# Patient Record
Sex: Female | Born: 1950 | Race: White | Hispanic: No | Smoking: Never smoker
Health system: Southern US, Community
[De-identification: ages and names within clinical notes are randomized; demographics above are authoritative.]

## PROBLEM LIST (undated history)

## (undated) DIAGNOSIS — C50919 Malignant neoplasm of unspecified site of unspecified female breast: Secondary | ICD-10-CM

## (undated) DIAGNOSIS — Z853 Personal history of malignant neoplasm of breast: Secondary | ICD-10-CM

## (undated) DIAGNOSIS — Z801 Family history of malignant neoplasm of trachea, bronchus and lung: Secondary | ICD-10-CM

## (undated) DIAGNOSIS — Z9221 Personal history of antineoplastic chemotherapy: Secondary | ICD-10-CM

## (undated) DIAGNOSIS — Z803 Family history of malignant neoplasm of breast: Secondary | ICD-10-CM

## (undated) DIAGNOSIS — Z923 Personal history of irradiation: Secondary | ICD-10-CM

## (undated) DIAGNOSIS — Z800 Family history of malignant neoplasm of digestive organs: Secondary | ICD-10-CM

## (undated) HISTORY — DX: Personal history of malignant neoplasm of breast: Z85.3

## (undated) HISTORY — DX: Family history of malignant neoplasm of digestive organs: Z80.0

## (undated) HISTORY — DX: Family history of malignant neoplasm of trachea, bronchus and lung: Z80.1

## (undated) HISTORY — PX: REDUCTION MAMMAPLASTY: SUR839

## (undated) HISTORY — DX: Family history of malignant neoplasm of breast: Z80.3

## (undated) HISTORY — PX: BREAST BIOPSY: SHX20

---

## 1997-03-15 HISTORY — PX: BREAST LUMPECTOMY: SHX2

## 2000-07-29 ENCOUNTER — Other Ambulatory Visit: Admission: RE | Admit: 2000-07-29 | Discharge: 2000-07-29 | Payer: Self-pay | Admitting: *Deleted

## 2000-08-09 ENCOUNTER — Encounter: Admission: RE | Admit: 2000-08-09 | Discharge: 2000-08-09 | Payer: Self-pay | Admitting: *Deleted

## 2000-08-09 ENCOUNTER — Encounter: Payer: Self-pay | Admitting: *Deleted

## 2001-08-11 ENCOUNTER — Encounter: Payer: Self-pay | Admitting: *Deleted

## 2001-08-11 ENCOUNTER — Encounter: Admission: RE | Admit: 2001-08-11 | Discharge: 2001-08-11 | Payer: Self-pay | Admitting: *Deleted

## 2001-09-05 ENCOUNTER — Other Ambulatory Visit: Admission: RE | Admit: 2001-09-05 | Discharge: 2001-09-05 | Payer: Self-pay | Admitting: *Deleted

## 2002-06-19 ENCOUNTER — Encounter: Admission: RE | Admit: 2002-06-19 | Discharge: 2002-06-19 | Payer: Self-pay | Admitting: Internal Medicine

## 2002-06-19 ENCOUNTER — Encounter: Payer: Self-pay | Admitting: Internal Medicine

## 2002-08-15 ENCOUNTER — Encounter: Payer: Self-pay | Admitting: *Deleted

## 2002-08-15 ENCOUNTER — Encounter: Admission: RE | Admit: 2002-08-15 | Discharge: 2002-08-15 | Payer: Self-pay | Admitting: *Deleted

## 2002-10-22 ENCOUNTER — Other Ambulatory Visit: Admission: RE | Admit: 2002-10-22 | Discharge: 2002-10-22 | Payer: Self-pay | Admitting: *Deleted

## 2003-08-21 ENCOUNTER — Encounter: Admission: RE | Admit: 2003-08-21 | Discharge: 2003-08-21 | Payer: Self-pay | Admitting: Oncology

## 2004-02-20 ENCOUNTER — Ambulatory Visit: Payer: Self-pay | Admitting: Oncology

## 2004-08-27 ENCOUNTER — Encounter: Admission: RE | Admit: 2004-08-27 | Discharge: 2004-08-27 | Payer: Self-pay | Admitting: Oncology

## 2005-02-18 ENCOUNTER — Ambulatory Visit: Payer: Self-pay | Admitting: Oncology

## 2005-08-31 ENCOUNTER — Encounter: Admission: RE | Admit: 2005-08-31 | Discharge: 2005-08-31 | Payer: Self-pay | Admitting: Oncology

## 2006-02-08 ENCOUNTER — Ambulatory Visit: Payer: Self-pay | Admitting: Oncology

## 2006-02-11 LAB — CBC WITH DIFFERENTIAL/PLATELET
BASO%: 0.6 % (ref 0.0–2.0)
Basophils Absolute: 0 10*3/uL (ref 0.0–0.1)
EOS%: 2.7 % (ref 0.0–7.0)
Eosinophils Absolute: 0.1 10*3/uL (ref 0.0–0.5)
HCT: 37.5 % (ref 34.8–46.6)
HGB: 13 g/dL (ref 11.6–15.9)
LYMPH%: 25.9 % (ref 14.0–48.0)
MCH: 33.1 pg (ref 26.0–34.0)
MCHC: 34.7 g/dL (ref 32.0–36.0)
MCV: 95.2 fL (ref 81.0–101.0)
MONO#: 0.4 10*3/uL (ref 0.1–0.9)
MONO%: 9.2 % (ref 0.0–13.0)
NEUT#: 3 10*3/uL (ref 1.5–6.5)
NEUT%: 61.6 % (ref 39.6–76.8)
Platelets: 183 10*3/uL (ref 145–400)
RBC: 3.93 10*6/uL (ref 3.70–5.32)
RDW: 13.6 % (ref 11.3–14.5)
WBC: 4.8 10*3/uL (ref 3.9–10.0)
lymph#: 1.2 10*3/uL (ref 0.9–3.3)

## 2006-02-11 LAB — COMPREHENSIVE METABOLIC PANEL
ALT: 15 U/L (ref 0–35)
AST: 16 U/L (ref 0–37)
Albumin: 4.3 g/dL (ref 3.5–5.2)
Alkaline Phosphatase: 65 U/L (ref 39–117)
BUN: 19 mg/dL (ref 6–23)
CO2: 26 mEq/L (ref 19–32)
Calcium: 9.5 mg/dL (ref 8.4–10.5)
Chloride: 105 mEq/L (ref 96–112)
Creatinine, Ser: 0.79 mg/dL (ref 0.40–1.20)
Glucose, Bld: 68 mg/dL — ABNORMAL LOW (ref 70–99)
Potassium: 3.9 mEq/L (ref 3.5–5.3)
Sodium: 138 mEq/L (ref 135–145)
Total Bilirubin: 0.4 mg/dL (ref 0.3–1.2)
Total Protein: 7 g/dL (ref 6.0–8.3)

## 2006-09-07 ENCOUNTER — Encounter: Admission: RE | Admit: 2006-09-07 | Discharge: 2006-09-07 | Payer: Self-pay | Admitting: Oncology

## 2006-09-26 ENCOUNTER — Encounter: Admission: RE | Admit: 2006-09-26 | Discharge: 2006-09-26 | Payer: Self-pay | Admitting: Oncology

## 2007-02-13 ENCOUNTER — Ambulatory Visit: Payer: Self-pay | Admitting: Oncology

## 2007-02-13 LAB — COMPREHENSIVE METABOLIC PANEL
ALT: 15 U/L (ref 0–35)
AST: 18 U/L (ref 0–37)
Albumin: 4.5 g/dL (ref 3.5–5.2)
Alkaline Phosphatase: 68 U/L (ref 39–117)
BUN: 17 mg/dL (ref 6–23)
CO2: 25 mEq/L (ref 19–32)
Calcium: 10.4 mg/dL (ref 8.4–10.5)
Chloride: 104 mEq/L (ref 96–112)
Creatinine, Ser: 0.94 mg/dL (ref 0.40–1.20)
Glucose, Bld: 78 mg/dL (ref 70–99)
Potassium: 4.2 mEq/L (ref 3.5–5.3)
Sodium: 140 mEq/L (ref 135–145)
Total Bilirubin: 0.5 mg/dL (ref 0.3–1.2)
Total Protein: 7.4 g/dL (ref 6.0–8.3)

## 2007-02-13 LAB — CBC WITH DIFFERENTIAL/PLATELET
BASO%: 0.5 % (ref 0.0–2.0)
Basophils Absolute: 0 10*3/uL (ref 0.0–0.1)
EOS%: 1.8 % (ref 0.0–7.0)
Eosinophils Absolute: 0.1 10*3/uL (ref 0.0–0.5)
HCT: 38.2 % (ref 34.8–46.6)
HGB: 13.6 g/dL (ref 11.6–15.9)
LYMPH%: 32.3 % (ref 14.0–48.0)
MCH: 33.6 pg (ref 26.0–34.0)
MCHC: 35.6 g/dL (ref 32.0–36.0)
MCV: 94.5 fL (ref 81.0–101.0)
MONO#: 0.4 10*3/uL (ref 0.1–0.9)
MONO%: 10.7 % (ref 0.0–13.0)
NEUT#: 2.1 10*3/uL (ref 1.5–6.5)
NEUT%: 54.7 % (ref 39.6–76.8)
Platelets: 168 10*3/uL (ref 145–400)
RBC: 4.04 10*6/uL (ref 3.70–5.32)
RDW: 12.9 % (ref 11.3–14.5)
WBC: 3.9 10*3/uL (ref 3.9–10.0)
lymph#: 1.2 10*3/uL (ref 0.9–3.3)

## 2007-09-08 ENCOUNTER — Encounter: Admission: RE | Admit: 2007-09-08 | Discharge: 2007-09-08 | Payer: Self-pay | Admitting: Oncology

## 2008-01-16 ENCOUNTER — Encounter: Admission: RE | Admit: 2008-01-16 | Discharge: 2008-01-16 | Payer: Self-pay | Admitting: Family Medicine

## 2008-02-21 ENCOUNTER — Ambulatory Visit: Payer: Self-pay | Admitting: Oncology

## 2008-02-23 LAB — CBC WITH DIFFERENTIAL/PLATELET
BASO%: 0.6 % (ref 0.0–2.0)
Basophils Absolute: 0 10*3/uL (ref 0.0–0.1)
EOS%: 2.4 % (ref 0.0–7.0)
Eosinophils Absolute: 0.1 10*3/uL (ref 0.0–0.5)
HCT: 39.5 % (ref 34.8–46.6)
HGB: 13.8 g/dL (ref 11.6–15.9)
LYMPH%: 30.5 % (ref 14.0–48.0)
MCH: 33.2 pg (ref 26.0–34.0)
MCHC: 35 g/dL (ref 32.0–36.0)
MCV: 94.9 fL (ref 81.0–101.0)
MONO#: 0.3 10*3/uL (ref 0.1–0.9)
MONO%: 7.7 % (ref 0.0–13.0)
NEUT#: 2.4 10*3/uL (ref 1.5–6.5)
NEUT%: 58.8 % (ref 39.6–76.8)
Platelets: 180 10*3/uL (ref 145–400)
RBC: 4.16 10*6/uL (ref 3.70–5.32)
RDW: 12.9 % (ref 11.3–14.5)
WBC: 4.1 10*3/uL (ref 3.9–10.0)
lymph#: 1.3 10*3/uL (ref 0.9–3.3)

## 2008-02-23 LAB — COMPREHENSIVE METABOLIC PANEL
ALT: 13 U/L (ref 0–35)
AST: 17 U/L (ref 0–37)
Albumin: 4.3 g/dL (ref 3.5–5.2)
Alkaline Phosphatase: 57 U/L (ref 39–117)
BUN: 13 mg/dL (ref 6–23)
CO2: 27 mEq/L (ref 19–32)
Calcium: 9.9 mg/dL (ref 8.4–10.5)
Chloride: 104 mEq/L (ref 96–112)
Creatinine, Ser: 0.82 mg/dL (ref 0.40–1.20)
Glucose, Bld: 84 mg/dL (ref 70–99)
Potassium: 3.8 mEq/L (ref 3.5–5.3)
Sodium: 139 mEq/L (ref 135–145)
Total Bilirubin: 0.6 mg/dL (ref 0.3–1.2)
Total Protein: 7.2 g/dL (ref 6.0–8.3)

## 2008-09-09 ENCOUNTER — Encounter: Admission: RE | Admit: 2008-09-09 | Discharge: 2008-09-09 | Payer: Self-pay | Admitting: Oncology

## 2009-02-26 ENCOUNTER — Ambulatory Visit: Payer: Self-pay | Admitting: Oncology

## 2009-02-28 LAB — CBC WITH DIFFERENTIAL/PLATELET
BASO%: 0.4 % (ref 0.0–2.0)
Basophils Absolute: 0 10*3/uL (ref 0.0–0.1)
EOS%: 1.9 % (ref 0.0–7.0)
Eosinophils Absolute: 0.1 10*3/uL (ref 0.0–0.5)
HCT: 40.2 % (ref 34.8–46.6)
HGB: 13.9 g/dL (ref 11.6–15.9)
LYMPH%: 28.6 % (ref 14.0–49.7)
MCH: 33.8 pg (ref 25.1–34.0)
MCHC: 34.6 g/dL (ref 31.5–36.0)
MCV: 97.7 fL (ref 79.5–101.0)
MONO#: 0.4 10*3/uL (ref 0.1–0.9)
MONO%: 9.7 % (ref 0.0–14.0)
NEUT#: 2.5 10*3/uL (ref 1.5–6.5)
NEUT%: 59.4 % (ref 38.4–76.8)
Platelets: 180 10*3/uL (ref 145–400)
RBC: 4.11 10*6/uL (ref 3.70–5.45)
RDW: 13.6 % (ref 11.2–14.5)
WBC: 4.2 10*3/uL (ref 3.9–10.3)
lymph#: 1.2 10*3/uL (ref 0.9–3.3)

## 2009-02-28 LAB — COMPREHENSIVE METABOLIC PANEL
ALT: 14 U/L (ref 0–35)
AST: 16 U/L (ref 0–37)
Albumin: 4.3 g/dL (ref 3.5–5.2)
Alkaline Phosphatase: 56 U/L (ref 39–117)
BUN: 16 mg/dL (ref 6–23)
CO2: 28 mEq/L (ref 19–32)
Calcium: 9.8 mg/dL (ref 8.4–10.5)
Chloride: 104 mEq/L (ref 96–112)
Creatinine, Ser: 0.86 mg/dL (ref 0.40–1.20)
Glucose, Bld: 72 mg/dL (ref 70–99)
Potassium: 3.8 mEq/L (ref 3.5–5.3)
Sodium: 140 mEq/L (ref 135–145)
Total Bilirubin: 0.4 mg/dL (ref 0.3–1.2)
Total Protein: 6.9 g/dL (ref 6.0–8.3)

## 2009-09-09 ENCOUNTER — Encounter: Admission: RE | Admit: 2009-09-09 | Discharge: 2009-09-09 | Payer: Self-pay | Admitting: Oncology

## 2010-02-26 ENCOUNTER — Ambulatory Visit: Payer: Self-pay | Admitting: Oncology

## 2010-02-27 LAB — CBC WITH DIFFERENTIAL/PLATELET
BASO%: 0.5 % (ref 0.0–2.0)
Basophils Absolute: 0 10*3/uL (ref 0.0–0.1)
EOS%: 1.6 % (ref 0.0–7.0)
Eosinophils Absolute: 0.1 10*3/uL (ref 0.0–0.5)
HCT: 40 % (ref 34.8–46.6)
HGB: 13.7 g/dL (ref 11.6–15.9)
LYMPH%: 32.8 % (ref 14.0–49.7)
MCH: 33 pg (ref 25.1–34.0)
MCHC: 34.1 g/dL (ref 31.5–36.0)
MCV: 96.6 fL (ref 79.5–101.0)
MONO#: 0.4 10*3/uL (ref 0.1–0.9)
MONO%: 9.6 % (ref 0.0–14.0)
NEUT#: 2.3 10*3/uL (ref 1.5–6.5)
NEUT%: 55.5 % (ref 38.4–76.8)
Platelets: 184 10*3/uL (ref 145–400)
RBC: 4.14 10*6/uL (ref 3.70–5.45)
RDW: 13.3 % (ref 11.2–14.5)
WBC: 4.1 10*3/uL (ref 3.9–10.3)
lymph#: 1.3 10*3/uL (ref 0.9–3.3)

## 2010-02-27 LAB — COMPREHENSIVE METABOLIC PANEL
ALT: 15 U/L (ref 0–35)
AST: 18 U/L (ref 0–37)
Albumin: 4.3 g/dL (ref 3.5–5.2)
Alkaline Phosphatase: 53 U/L (ref 39–117)
BUN: 13 mg/dL (ref 6–23)
CO2: 29 mEq/L (ref 19–32)
Calcium: 10 mg/dL (ref 8.4–10.5)
Chloride: 103 mEq/L (ref 96–112)
Creatinine, Ser: 0.81 mg/dL (ref 0.40–1.20)
Glucose, Bld: 95 mg/dL (ref 70–99)
Potassium: 3.9 mEq/L (ref 3.5–5.3)
Sodium: 140 mEq/L (ref 135–145)
Total Bilirubin: 0.5 mg/dL (ref 0.3–1.2)
Total Protein: 7 g/dL (ref 6.0–8.3)

## 2010-03-04 ENCOUNTER — Encounter
Admission: RE | Admit: 2010-03-04 | Discharge: 2010-03-04 | Payer: Self-pay | Source: Home / Self Care | Attending: Oncology | Admitting: Oncology

## 2010-08-18 ENCOUNTER — Other Ambulatory Visit: Payer: Self-pay | Admitting: Oncology

## 2010-08-18 DIAGNOSIS — Z853 Personal history of malignant neoplasm of breast: Secondary | ICD-10-CM

## 2010-08-18 DIAGNOSIS — Z1231 Encounter for screening mammogram for malignant neoplasm of breast: Secondary | ICD-10-CM

## 2010-09-15 ENCOUNTER — Ambulatory Visit
Admission: RE | Admit: 2010-09-15 | Discharge: 2010-09-15 | Disposition: A | Discharge status: Home / Self Care | Payer: BC Managed Care – PPO | Source: Ambulatory Visit | Attending: Oncology | Admitting: Oncology

## 2010-09-15 DIAGNOSIS — Z1231 Encounter for screening mammogram for malignant neoplasm of breast: Secondary | ICD-10-CM

## 2010-09-15 DIAGNOSIS — Z853 Personal history of malignant neoplasm of breast: Secondary | ICD-10-CM

## 2010-11-11 ENCOUNTER — Other Ambulatory Visit: Payer: Self-pay | Admitting: Oncology

## 2010-11-11 ENCOUNTER — Encounter (HOSPITAL_BASED_OUTPATIENT_CLINIC_OR_DEPARTMENT_OTHER): Payer: BC Managed Care – PPO | Admitting: Oncology

## 2010-11-11 DIAGNOSIS — Z1231 Encounter for screening mammogram for malignant neoplasm of breast: Secondary | ICD-10-CM

## 2010-11-11 DIAGNOSIS — Z853 Personal history of malignant neoplasm of breast: Secondary | ICD-10-CM

## 2010-11-11 LAB — COMPREHENSIVE METABOLIC PANEL
ALT: 15 U/L (ref 0–35)
AST: 17 U/L (ref 0–37)
Albumin: 4.5 g/dL (ref 3.5–5.2)
Alkaline Phosphatase: 25 U/L — ABNORMAL LOW (ref 39–117)
BUN: 13 mg/dL (ref 6–23)
CO2: 26 mEq/L (ref 19–32)
Calcium: 9.7 mg/dL (ref 8.4–10.5)
Chloride: 103 mEq/L (ref 96–112)
Creatinine, Ser: 0.81 mg/dL (ref 0.50–1.10)
Glucose, Bld: 54 mg/dL — ABNORMAL LOW (ref 70–99)
Potassium: 4.2 mEq/L (ref 3.5–5.3)
Sodium: 139 mEq/L (ref 135–145)
Total Bilirubin: 0.4 mg/dL (ref 0.3–1.2)
Total Protein: 7 g/dL (ref 6.0–8.3)

## 2010-11-11 LAB — CBC WITH DIFFERENTIAL/PLATELET
BASO%: 0.4 % (ref 0.0–2.0)
Basophils Absolute: 0 10*3/uL (ref 0.0–0.1)
EOS%: 1.3 % (ref 0.0–7.0)
Eosinophils Absolute: 0.1 10*3/uL (ref 0.0–0.5)
HCT: 37.7 % (ref 34.8–46.6)
HGB: 13.2 g/dL (ref 11.6–15.9)
LYMPH%: 26.5 % (ref 14.0–49.7)
MCH: 33.5 pg (ref 25.1–34.0)
MCHC: 35 g/dL (ref 31.5–36.0)
MCV: 95.5 fL (ref 79.5–101.0)
MONO#: 0.5 10*3/uL (ref 0.1–0.9)
MONO%: 9.7 % (ref 0.0–14.0)
NEUT#: 3.3 10*3/uL (ref 1.5–6.5)
NEUT%: 62.1 % (ref 38.4–76.8)
Platelets: 179 10*3/uL (ref 145–400)
RBC: 3.95 10*6/uL (ref 3.70–5.45)
RDW: 12.7 % (ref 11.2–14.5)
WBC: 5.2 10*3/uL (ref 3.9–10.3)
lymph#: 1.4 10*3/uL (ref 0.9–3.3)

## 2011-09-21 ENCOUNTER — Ambulatory Visit: Payer: BC Managed Care – PPO

## 2011-09-28 ENCOUNTER — Ambulatory Visit
Admission: RE | Admit: 2011-09-28 | Discharge: 2011-09-28 | Disposition: A | Discharge status: Home / Self Care | Payer: BC Managed Care – PPO | Source: Ambulatory Visit | Attending: Oncology | Admitting: Oncology

## 2011-09-28 DIAGNOSIS — Z1231 Encounter for screening mammogram for malignant neoplasm of breast: Secondary | ICD-10-CM

## 2011-10-08 ENCOUNTER — Telehealth: Payer: Self-pay | Admitting: Oncology

## 2011-10-08 NOTE — Telephone Encounter (Signed)
lmonvm for pt re appt for 8/28 and mailed schedule.

## 2011-11-10 ENCOUNTER — Ambulatory Visit: Payer: BC Managed Care – PPO | Admitting: Oncology

## 2011-11-10 ENCOUNTER — Other Ambulatory Visit: Payer: BC Managed Care – PPO | Admitting: Lab

## 2011-11-24 ENCOUNTER — Other Ambulatory Visit (HOSPITAL_COMMUNITY): Payer: Self-pay | Admitting: Chiropractic Medicine

## 2011-11-24 ENCOUNTER — Ambulatory Visit (HOSPITAL_COMMUNITY)
Admission: RE | Admit: 2011-11-24 | Discharge: 2011-11-24 | Disposition: A | Discharge status: Home / Self Care | Payer: BC Managed Care – PPO | Source: Ambulatory Visit | Attending: Chiropractic Medicine | Admitting: Chiropractic Medicine

## 2011-11-24 DIAGNOSIS — J984 Other disorders of lung: Secondary | ICD-10-CM | POA: Insufficient documentation

## 2011-11-24 DIAGNOSIS — M47812 Spondylosis without myelopathy or radiculopathy, cervical region: Secondary | ICD-10-CM | POA: Insufficient documentation

## 2011-11-24 DIAGNOSIS — M25519 Pain in unspecified shoulder: Secondary | ICD-10-CM | POA: Insufficient documentation

## 2011-11-24 DIAGNOSIS — R52 Pain, unspecified: Secondary | ICD-10-CM

## 2011-11-26 ENCOUNTER — Other Ambulatory Visit (HOSPITAL_BASED_OUTPATIENT_CLINIC_OR_DEPARTMENT_OTHER): Payer: BC Managed Care – PPO | Admitting: Lab

## 2011-11-26 ENCOUNTER — Other Ambulatory Visit: Payer: Self-pay

## 2011-11-26 ENCOUNTER — Ambulatory Visit (HOSPITAL_BASED_OUTPATIENT_CLINIC_OR_DEPARTMENT_OTHER): Payer: BC Managed Care – PPO | Admitting: Oncology

## 2011-11-26 VITALS — BP 110/70 | HR 70 | Temp 97.6°F | Resp 20 | Ht 66.5 in | Wt 159.7 lb

## 2011-11-26 DIAGNOSIS — Z853 Personal history of malignant neoplasm of breast: Secondary | ICD-10-CM

## 2011-11-26 DIAGNOSIS — C50919 Malignant neoplasm of unspecified site of unspecified female breast: Secondary | ICD-10-CM

## 2011-11-26 DIAGNOSIS — M62838 Other muscle spasm: Secondary | ICD-10-CM

## 2011-11-26 DIAGNOSIS — M899 Disorder of bone, unspecified: Secondary | ICD-10-CM

## 2011-11-26 DIAGNOSIS — M949 Disorder of cartilage, unspecified: Secondary | ICD-10-CM

## 2011-11-26 DIAGNOSIS — M5030 Other cervical disc degeneration, unspecified cervical region: Secondary | ICD-10-CM

## 2011-11-26 LAB — COMPREHENSIVE METABOLIC PANEL (CC13)
ALT: 16 U/L (ref 0–55)
AST: 17 U/L (ref 5–34)
Albumin: 3.9 g/dL (ref 3.5–5.0)
Alkaline Phosphatase: 58 U/L (ref 40–150)
BUN: 14 mg/dL (ref 7.0–26.0)
CO2: 25 mEq/L (ref 22–29)
Calcium: 9.6 mg/dL (ref 8.4–10.4)
Chloride: 105 mEq/L (ref 98–107)
Creatinine: 0.8 mg/dL (ref 0.6–1.1)
Glucose: 79 mg/dl (ref 70–99)
Potassium: 4.6 mEq/L (ref 3.5–5.1)
Sodium: 139 mEq/L (ref 136–145)
Total Bilirubin: 0.3 mg/dL (ref 0.20–1.20)
Total Protein: 6.9 g/dL (ref 6.4–8.3)

## 2011-11-26 LAB — CBC WITH DIFFERENTIAL/PLATELET
BASO%: 0.7 % (ref 0.0–2.0)
Basophils Absolute: 0 10*3/uL (ref 0.0–0.1)
EOS%: 1.1 % (ref 0.0–7.0)
Eosinophils Absolute: 0.1 10*3/uL (ref 0.0–0.5)
HCT: 38.8 % (ref 34.8–46.6)
HGB: 13.3 g/dL (ref 11.6–15.9)
LYMPH%: 30.4 % (ref 14.0–49.7)
MCH: 33.4 pg (ref 25.1–34.0)
MCHC: 34.2 g/dL (ref 31.5–36.0)
MCV: 97.6 fL (ref 79.5–101.0)
MONO#: 0.7 10*3/uL (ref 0.1–0.9)
MONO%: 11.8 % (ref 0.0–14.0)
NEUT#: 3.2 10*3/uL (ref 1.5–6.5)
NEUT%: 56 % (ref 38.4–76.8)
Platelets: 169 10*3/uL (ref 145–400)
RBC: 3.98 10*6/uL (ref 3.70–5.45)
RDW: 12.9 % (ref 11.2–14.5)
WBC: 5.7 10*3/uL (ref 3.9–10.3)
lymph#: 1.7 10*3/uL (ref 0.9–3.3)

## 2011-11-26 MED ORDER — CYCLOBENZAPRINE HCL 10 MG PO TABS: 10.0000 mg | ORAL_TABLET | Freq: Three times a day (TID) | ORAL | Status: DC | PRN

## 2011-11-26 NOTE — Patient Instructions (Signed)
We will send prescription for Flexeril to [facility]  You should also use Aleve one every 8 hrs with food thru this weekend.  Refrain from activities that increase the shoulder and arm pain.  Call PCP for follow up within the week.

## 2011-11-26 NOTE — Progress Notes (Signed)
OFFICE PROGRESS NOTE   11/26/2011   Physicians: [facility] [address])  INTERVAL HISTORY:  Patient is seen, alone for visit, in scheduled yearly follow up of her history of left breast carcinoma, not known recurrent since initial treatment and on observation thru this office.  History is of T2N1 ER positive/ PR negative/Her 2 negative left breast cancer in July 2001 at age 61 and premenopausal.  Records concerning diagnosis, surgery and adjuvant chemotherapy were in paper records prior to [facility] and should now be on microfilm, but, in summary, this was 2.5 cm moderately differentiated infiltrating ductal carcinoma with + angiolymphatic invasion and involvement of sentinel node (not clear from my review in 2004 if additional nodes were evaluated), ER + 60-70%, PR and Her 2 negative. She had central lumpectomy with at least one sentinel node, adriamycin/cytoxan x 4 thru 01/1998 (total adria dose 240 mg/ m2) then taxane x 4 thru 05/1998 (intolerant to initial taxotere, completed with taxol). She had local RT 5000 cGy to breast with additional 1000 cGy boost to tumor bed. She was on tamoxifen x 5 years beginning 11/1998, intolerant to two brief trials on aromatase inhibitor. She became postmenopausal thru treatment. She has had osteopenia by previous bone density scans. [facility] with scattered fibroglandular densities and no mammographic findings of concern; last breast MRI was 2008.   Patient has been well since she was here last with exception of pain left neck to left upper extremity directly related to very [place] in [place]. She has used occasional ibuprofen when pain is most severe, and has seen chiropracter for past month. She had Cspine and CXR in [facility] 11-24-11, which show cervical spondylosis and disc space narrowing C 5-6 and 6-7 with no acute bony abnormalities, and no acute findings on single view CXR.  Discomfort is exacerbated by exercise walking (5 miles daily at brisk pace), holding steering wheel and with computer work.   Otherwise she has no pain, good energy, no respiratory or GI symptoms, no fever or recent infection, no bleeding, no noted changes on breast self exam. Remainder of 10 point Review of Systems negative.  Objective:  Vital signs in last 24 hours:  BP 110/70  Pulse 70  Temp 97.6 F (36.4 C) (Oral)  Resp 20  Ht 5' 6.5" (1.689 m)  Wt 159 lb 11.2 oz (72.439 kg)  BMI 25.39 kg/m2 Weight is stable from a year ago. Easily ambulatory, favors left neck/ shoulder.     HEENT:PERRLA, sclera clear, anicteric and oropharynx clear, no lesions LymphaticsCervical, supraclavicular, and axillary nodes normal. No inguinal adenopathy Resp: clear to auscultation bilaterally and normal percussion bilaterally Back symmetrical without discrete tenderness over cervical or thoracic spine. Cardio: regular rate and rhythm GI: soft, non-tender; bowel sounds normal; no masses,  no organomegaly Extremities: extremities normal, atraumatic, no cyanosis or edema Breasts: left with central partial mastectomy, no dominant mass, no skin or nipple findings, nothing left axilla, no swelling LUE. Right breast with well-healed scars from reduction including around areola, nipple inverted as chronically, no dominant mass, no skin changes, nothing right axilla or RUE. Skin without rash or ecchymoses  Lab Results:  Results for orders placed in visit on 11/26/11  CBC WITH DIFFERENTIAL      Component Value Range   WBC 5.7  3.9 - 10.3 10e3/uL   NEUT# 3.2  1.5 - 6.5 10e3/uL   HGB 13.3  11.6 - 15.9 g/dL  HCT 38.8  34.8 - 46.6 %   Platelets 169  145 - 400 10e3/uL   MCV 97.6  79.5 - 101.0 fL   MCH 33.4  25.1 - 34.0 pg   MCHC 34.2  31.5 - 36.0 g/dL   RBC 2.72  5.36 - 6.44 10e6/uL   RDW 12.9  11.2 - 14.5 %   lymph# 1.7  0.9 - 3.3 10e3/uL   MONO# 0.7  0.1 - 0.9 10e3/uL   Eosinophils Absolute 0.1  0.0 -  0.5 10e3/uL   Basophils Absolute 0.0  0.0 - 0.1 10e3/uL   NEUT% 56.0  38.4 - 76.8 %   LYMPH% 30.4  14.0 - 49.7 %   MONO% 11.8  0.0 - 14.0 %   EOS% 1.1  0.0 - 7.0 %   BASO% 0.7  0.0 - 2.0 %  COMPREHENSIVE METABOLIC PANEL (CC13)      Component Value Range   Sodium 139  136 - 145 mEq/L   Potassium 4.6  3.5 - 5.1 mEq/L   Chloride 105  98 - 107 mEq/L   CO2 25  22 - 29 mEq/L   Glucose 79  70 - 99 mg/dl   BUN 03.4  7.0 - 74.2 mg/dL   Creatinine 0.8  0.6 - 1.1 mg/dL   Total Bilirubin 5.95  0.20 - 1.20 mg/dL   Alkaline Phosphatase 58  40 - 150 U/L   AST 17  5 - 34 U/L   ALT 16  0 - 55 U/L   Total Protein 6.9  6.4 - 8.3 g/dL   Albumin 3.9  3.5 - 5.0 g/dL   Calcium 9.6  8.4 - 63.8 mg/dL     Studies/Results: [facility] as above. CXR and Cspine Xrays 11-24-11 as above.  Medications: I have reviewed the patient's current medications. I have asked her to take aleve with food every 8 hours x 3 days and will send prescription for Flexeril to pharmacy which she will use over weekend, until she is able to contact PCP next week.  Assessment/Plan: 1.T2N1 left breast cancer: now out 14 years from diagnosis, post treatment as above, and no known active disease. She is in agreement with changing follow up at this office to prn, and understands that I can see her at any time if she or other physicians request. She needs yearly mammograms and I would suggest yearly PE with CBC/diff and CMET due to previous treatment. 2.apparent cervical disc disease, aggravated by carrying heavy luggage in June. I have told her not to do any activities (including exercise walking) thru this weekend, use regular OTC NSAID and muscle relaxant also thru weekend and follow up with PCP 3.osteopenia by previous bone density scans 4.hx breast reduction bilaterally 5.hx migraines   Patient was in agreement with plan above.     Allyson Tineo P, MD   11/26/2011, 4:08 PM

## 2011-11-27 ENCOUNTER — Encounter: Payer: Self-pay | Admitting: Oncology

## 2011-12-02 ENCOUNTER — Other Ambulatory Visit: Payer: Self-pay | Admitting: Family Medicine

## 2011-12-02 DIAGNOSIS — Z780 Asymptomatic menopausal state: Secondary | ICD-10-CM

## 2011-12-08 ENCOUNTER — Ambulatory Visit
Admission: RE | Admit: 2011-12-08 | Discharge: 2011-12-08 | Disposition: A | Discharge status: Home / Self Care | Payer: BC Managed Care – PPO | Source: Ambulatory Visit | Attending: Family Medicine | Admitting: Family Medicine

## 2011-12-08 DIAGNOSIS — Z780 Asymptomatic menopausal state: Secondary | ICD-10-CM

## 2011-12-16 ENCOUNTER — Other Ambulatory Visit (HOSPITAL_COMMUNITY)
Admission: RE | Admit: 2011-12-16 | Discharge: 2011-12-16 | Disposition: A | Discharge status: Home / Self Care | Payer: BC Managed Care – PPO | Source: Ambulatory Visit | Attending: Family Medicine | Admitting: Family Medicine

## 2011-12-16 ENCOUNTER — Other Ambulatory Visit: Payer: Self-pay | Admitting: Family Medicine

## 2011-12-16 DIAGNOSIS — Z0000 Encounter for general adult medical examination without abnormal findings: Secondary | ICD-10-CM | POA: Insufficient documentation

## 2012-10-25 ENCOUNTER — Other Ambulatory Visit: Payer: Self-pay

## 2012-10-25 DIAGNOSIS — Z1231 Encounter for screening mammogram for malignant neoplasm of breast: Secondary | ICD-10-CM

## 2012-11-21 ENCOUNTER — Ambulatory Visit
Admission: RE | Admit: 2012-11-21 | Discharge: 2012-11-21 | Disposition: A | Discharge status: Home / Self Care | Payer: BC Managed Care – PPO | Source: Ambulatory Visit

## 2012-11-21 DIAGNOSIS — Z1231 Encounter for screening mammogram for malignant neoplasm of breast: Secondary | ICD-10-CM

## 2013-11-01 ENCOUNTER — Other Ambulatory Visit: Payer: Self-pay

## 2013-11-01 DIAGNOSIS — Z1231 Encounter for screening mammogram for malignant neoplasm of breast: Secondary | ICD-10-CM

## 2013-11-22 ENCOUNTER — Ambulatory Visit
Admission: RE | Admit: 2013-11-22 | Discharge: 2013-11-22 | Disposition: A | Discharge status: Home / Self Care | Payer: BC Managed Care – PPO | Source: Ambulatory Visit

## 2013-11-22 DIAGNOSIS — Z1231 Encounter for screening mammogram for malignant neoplasm of breast: Secondary | ICD-10-CM

## 2014-04-18 ENCOUNTER — Other Ambulatory Visit: Payer: Self-pay | Admitting: Family Medicine

## 2014-04-18 ENCOUNTER — Other Ambulatory Visit (HOSPITAL_COMMUNITY)
Admission: RE | Admit: 2014-04-18 | Discharge: 2014-04-18 | Disposition: A | Discharge status: Home / Self Care | Payer: BC Managed Care – PPO | Source: Ambulatory Visit | Attending: Family Medicine | Admitting: Family Medicine

## 2014-04-18 DIAGNOSIS — Z1231 Encounter for screening mammogram for malignant neoplasm of breast: Secondary | ICD-10-CM

## 2014-04-18 DIAGNOSIS — Z01419 Encounter for gynecological examination (general) (routine) without abnormal findings: Secondary | ICD-10-CM | POA: Diagnosis present

## 2014-04-18 DIAGNOSIS — R5381 Other malaise: Secondary | ICD-10-CM

## 2014-04-19 ENCOUNTER — Other Ambulatory Visit: Payer: Self-pay | Admitting: Family Medicine

## 2014-04-19 DIAGNOSIS — M8580 Other specified disorders of bone density and structure, unspecified site: Secondary | ICD-10-CM

## 2014-04-24 LAB — CYTOLOGY - PAP

## 2014-12-09 ENCOUNTER — Ambulatory Visit: Payer: BC Managed Care – PPO

## 2014-12-09 ENCOUNTER — Other Ambulatory Visit: Payer: BC Managed Care – PPO

## 2014-12-30 ENCOUNTER — Other Ambulatory Visit: Payer: BC Managed Care – PPO

## 2014-12-30 ENCOUNTER — Ambulatory Visit: Payer: BC Managed Care – PPO

## 2015-01-29 ENCOUNTER — Ambulatory Visit
Admission: RE | Admit: 2015-01-29 | Discharge: 2015-01-29 | Disposition: A | Discharge status: Home / Self Care | Payer: BC Managed Care – PPO | Source: Ambulatory Visit | Attending: Family Medicine | Admitting: Family Medicine

## 2015-01-29 DIAGNOSIS — M8580 Other specified disorders of bone density and structure, unspecified site: Secondary | ICD-10-CM

## 2015-01-29 DIAGNOSIS — Z1231 Encounter for screening mammogram for malignant neoplasm of breast: Secondary | ICD-10-CM

## 2015-09-03 ENCOUNTER — Ambulatory Visit: Payer: Self-pay | Admitting: Podiatry

## 2015-11-13 ENCOUNTER — Other Ambulatory Visit (HOSPITAL_COMMUNITY)
Admission: RE | Admit: 2015-11-13 | Discharge: 2015-11-13 | Disposition: A | Discharge status: Home / Self Care | Payer: Medicare Other | Source: Ambulatory Visit | Attending: Family Medicine | Admitting: Family Medicine

## 2015-11-13 ENCOUNTER — Other Ambulatory Visit: Payer: Self-pay | Admitting: Family Medicine

## 2015-11-13 DIAGNOSIS — Z01419 Encounter for gynecological examination (general) (routine) without abnormal findings: Secondary | ICD-10-CM | POA: Diagnosis present

## 2015-11-18 LAB — CYTOLOGY - PAP

## 2015-12-11 ENCOUNTER — Ambulatory Visit
Admission: RE | Admit: 2015-12-11 | Discharge: 2015-12-11 | Disposition: A | Discharge status: Home / Self Care | Payer: Medicare Other | Source: Ambulatory Visit | Attending: Family Medicine | Admitting: Family Medicine

## 2015-12-11 ENCOUNTER — Other Ambulatory Visit: Payer: Self-pay | Admitting: Family Medicine

## 2015-12-11 DIAGNOSIS — M542 Cervicalgia: Secondary | ICD-10-CM

## 2015-12-29 ENCOUNTER — Other Ambulatory Visit: Payer: Self-pay | Admitting: Family Medicine

## 2015-12-29 DIAGNOSIS — Z1231 Encounter for screening mammogram for malignant neoplasm of breast: Secondary | ICD-10-CM

## 2016-02-09 ENCOUNTER — Ambulatory Visit
Admission: RE | Admit: 2016-02-09 | Discharge: 2016-02-09 | Disposition: A | Discharge status: Home / Self Care | Payer: Medicare Other | Source: Ambulatory Visit | Attending: Family Medicine | Admitting: Family Medicine

## 2016-02-09 DIAGNOSIS — Z1231 Encounter for screening mammogram for malignant neoplasm of breast: Secondary | ICD-10-CM

## 2016-12-31 ENCOUNTER — Other Ambulatory Visit: Payer: Self-pay | Admitting: Family Medicine

## 2016-12-31 DIAGNOSIS — Z1231 Encounter for screening mammogram for malignant neoplasm of breast: Secondary | ICD-10-CM

## 2017-02-16 ENCOUNTER — Ambulatory Visit
Admission: RE | Admit: 2017-02-16 | Discharge: 2017-02-16 | Disposition: A | Discharge status: Home / Self Care | Payer: Medicare Other | Source: Ambulatory Visit | Attending: Family Medicine | Admitting: Family Medicine

## 2017-02-16 DIAGNOSIS — Z1231 Encounter for screening mammogram for malignant neoplasm of breast: Secondary | ICD-10-CM

## 2017-09-28 ENCOUNTER — Telehealth: Payer: Self-pay | Admitting: Licensed Clinical Social Worker

## 2017-09-28 NOTE — Telephone Encounter (Signed)
Patient called requesting genetics appointment. Self referral due to hx of br ca -  seen on Internet. Also per patient family has some question about genetics.   Gave patient appointment for 8/19 @ 9 am. Mid August per patient request. Confirmed demographic and insurance information. Patient given date/time location.

## 2017-10-31 ENCOUNTER — Encounter: Payer: Self-pay | Admitting: Licensed Clinical Social Worker

## 2017-10-31 ENCOUNTER — Inpatient Hospital Stay: Payer: Medicare Other

## 2017-10-31 ENCOUNTER — Inpatient Hospital Stay: Payer: Medicare Other | Attending: Genetic Counselor | Admitting: Licensed Clinical Social Worker

## 2017-10-31 DIAGNOSIS — Z800 Family history of malignant neoplasm of digestive organs: Secondary | ICD-10-CM | POA: Diagnosis not present

## 2017-10-31 DIAGNOSIS — Z853 Personal history of malignant neoplasm of breast: Secondary | ICD-10-CM | POA: Insufficient documentation

## 2017-10-31 DIAGNOSIS — Z803 Family history of malignant neoplasm of breast: Secondary | ICD-10-CM | POA: Insufficient documentation

## 2017-10-31 DIAGNOSIS — Z801 Family history of malignant neoplasm of trachea, bronchus and lung: Secondary | ICD-10-CM | POA: Insufficient documentation

## 2017-10-31 NOTE — Progress Notes (Signed)
PRIMARY PROVIDER:  Kelton Pillar, MD  PRIMARY REASON FOR VISIT:  1. Personal history of breast cancer   2. Family history of pancreatic cancer   3. Family history of breast cancer   4. Family history of lung cancer      HISTORY OF PRESENT ILLNESS:   Tammy Fischer, a 67 y.o. female, was seen for a [place] cancer genetics consultation due to a personal and family history of cancer.  Tammy Fischer presents to clinic today to discuss the possibility of a hereditary predisposition to cancer, genetic testing, and to further clarify her future cancer risks, as well as potential cancer risks for family members.   In 1999, at the age of 61, Tammy Fischer was diagnosed with cancer of the left breast, ER+/PR-/Her2-. This was treated with lumpectomy, radiation and chemotherapy.    HORMONAL RISK FACTORS:  Menarche was at age 32.  First live birth at age 40.  OCP use for "many" years. Ovaries intact: yes.  Hysterectomy: no.  Menopausal status: postmenopausal.  Colonoscopy: yes; normal. Mammogram within the last year: yes. Number of breast biopsies: 1.  Past Medical History:  Diagnosis Date  . Family history of breast cancer   . Family history of lung cancer   . Family history of pancreatic cancer   . Personal history of breast cancer     No past surgical history on file.  Social History   Socioeconomic History  . Marital status: Married    Spouse name: Not on file  . Number of children: Not on file  . Years of education: Not on file  . Highest education level: Not on file  Occupational History  . Not on file  Social Needs  . Financial resource strain: Not on file  . Food insecurity:    Worry: Not on file    Inability: Not on file  . Transportation needs:    Medical: Not on file    Non-medical: Not on file  Tobacco Use  . Smoking status: Never Smoker  . Smokeless tobacco: Never Used  Substance and Sexual Activity  . Alcohol use: Not on file  . Drug use: Not on file  .  Sexual activity: Not on file  Lifestyle  . Physical activity:    Days per week: Not on file    Minutes per session: Not on file  . Stress: Not on file  Relationships  . Social connections:    Talks on phone: Not on file    Gets together: Not on file    Attends religious service: Not on file    Active member of club or organization: Not on file    Attends meetings of clubs or organizations: Not on file    Relationship status: Not on file  Other Topics Concern  . Not on file  Social History Narrative  . Not on file     FAMILY HISTORY:  We obtained a detailed, 4-generation family history.  Significant diagnoses are listed below: Family History  Problem Relation Age of Onset  . Lung cancer Mother        d. 35  . Pancreatic cancer Father        d. 57  . Heart Problems Maternal Grandmother   . Heart Problems Maternal Grandfather   . Heart Problems Paternal Grandmother   . Heart Problems Paternal Grandfather   . Breast cancer Other        paternal great aunt, dx. mid 69s   Tammy Fischer has two  sons, ages 61 and 75. No cancer history. She does not have grandchildren. Tammy Fischer has a sister, age 54, who has never had cancer but has had 3 benign tumors removed from her breast. Tammy Fischer reports that her sister has had genetic counseling and genetic testing but she does not remember what test she had or the results of it.   Tammy Fischer mother died of lung cancer at 61, she was a smoker. Tammy Fischer has two maternal aunts, no cancer history. One passed away at 82 and the other is living at 62. No cancer history in Tammy Fischer's maternal cousins. Tammy Fischer maternal grandmother passed away of heart/liver failure at 71, and her father died of an unknown cancer at a young age. Tammy Fischer maternal grandfather died of heart issues at 33.  Tammy Fischer father died at 43 of pancreatic cancer. He was an only child. His mother died of heart issues at 35 and her sister had breast cancer  diagnosed in her mid 52s. Tammy Fischer paternal grandfather also died of heart issues at 52.  Tammy Fischer is aware of previous family history of genetic testing for hereditary cancer risks, but does not have the results. Patient's maternal ancestors are of [place] descent, and paternal ancestors are of [place] descent. There is no reported Ashkenazi Jewish ancestry. There is no known consanguinity.  GENETIC COUNSELING ASSESSMENT: Tammy Fischer is a 67 y.o. female with a personal and family history which is somewhat suggestive of a Hereditary Cancer Predisposition Syndrome. We, therefore, discussed and recommended the following at today's visit.   DISCUSSION: We reviewed the characteristics, features and inheritance patterns of hereditary cancer syndromes. We also discussed genetic testing, including the appropriate family members to test, the process of testing, insurance coverage and turn-around-time for results. We discussed the implications of a negative, positive and/or variant of uncertain significant result. We recommended Tammy Fischer pursue genetic testing for the [facility] Common Hereditary Cancers 47 gene panel + Pancreatic cancer panel.   The [facility] Common Hereditary Cancer Panel + Pancreatic Cancer Panel (49 genes) analyzes the the following genes were evaluated for sequence changes and exonic deletions/duplications: APC, ATM, AXIN2, BARD1, BMPR1A, BRCA1, BRCA2, BRIP1, CDH1, CDK4, CDKN2A (p14ARF), CDKN2A (p16INK4a), CHEK2, CTNNA1, DICER1, EPCAM*, FANCC, GREM1*, KIT, MEN1, MLH1, MSH2, MSH3, MSH6, MUTYH, NBN, NF1, PALB2, PALLD, PDGFRA, PMS2, POLD1, POLE, PTEN, RAD50, RAD51C, RAD51D, SDHB, SDHC, SDHD, SMAD4, SMARCA4, STK11, TP53, TSC1, TSC2, VHL. The following genes were evaluated for sequence changes only: HOXB13*, NTHL1*, SDHA  We discussed that only 5-10% of cancers are associated with a Hereditary cancer predisposition syndrome.  One of the most common hereditary cancer syndromes that increases  breast and pancreatic cancer risk is called Hereditary Breast and Ovarian Cancer (HBOC) syndrome.  This syndrome is caused by mutations in the BRCA1 and BRCA2 genes.  This syndrome increases an individual's lifetime risk to develop breast, ovarian, pancreatic, and other types of cancer.  There are also many other cancer predisposition syndromes caused by mutations in several other genes.  We discussed that if she is found to have a mutation in one of these genes, it may impact future medical management recommendations such as increased cancer screenings and consideration of risk reducing surgeries.  A positive result could also have implications for the patient's family members.  A Negative result would mean we were unable to identify a hereditary component to her cancer, but does not rule out the possibility of a hereditary basis for her breast cancer or her  father's pancreatic cancer. There could be mutations that are undetectable by current technology, or in genes not yet tested or identified to increase cancer risk.    We discussed the potential to find a Variant of Uncertain Significance or VUS.  These are variants that have not yet been identified as pathogenic or benign, and it is unknown if this variant is associated with increased cancer risk or if this is a normal finding.  Most VUS's are reclassified to benign or likely benign.   It should not be used to make medical management decisions. With time, we suspect the lab will determine the significance of any VUS's identified if any.   Based on Tammy Fischer. Huard's personal and family history of cancer, she meets medical criteria for genetic testing. Despite that she meets criteria, she may still have an out of pocket cost. The lab will provide her with her out of pocket cost.   PLAN: After considering the risks, benefits, and limitations, Tammy Fischer  provided informed consent to pursue genetic testing and the blood sample was sent to [facility] for analysis of the Common Hereditary Cancers + Pancreatic Cancer Panel. Results should be available within approximately 2-3 weeks' time, at which point they will be disclosed by telephone to Tammy Fischer, as will any additional recommendations warranted by these results. Tammy Fischer. Hamor will receive a summary of her genetic counseling visit and a copy of her results once available. This information will also be available in Epic. We encouraged Tammy Fischer. Creek to remain in contact with cancer genetics annually so that we can continuously update the family history and inform her of any changes in cancer genetics and testing that may be of benefit for her family. Tammy Fischer. Berdan questions were answered to her satisfaction today. Our contact information was provided should additional questions or concerns arise.  Tammy Fischer.  Glaspy questions were answered to her satisfaction today. Our contact information was provided should additional questions or concerns arise. Thank you for the referral and allowing [place] to share in the care of your patient.   Epimenio Foot, Tammy Fischer Genetic Counselor [email or web address] Phone: [phone]  The patient was seen for a total of 40 minutes in face-to-face genetic counseling. This patient was discussed with Drs. Magrinat, Lindi Adie and/or Burr Medico who agrees with the above.

## 2017-11-08 ENCOUNTER — Encounter: Payer: Self-pay | Admitting: Licensed Clinical Social Worker

## 2017-11-08 ENCOUNTER — Telehealth: Payer: Self-pay | Admitting: Licensed Clinical Social Worker

## 2017-11-08 ENCOUNTER — Ambulatory Visit: Payer: Self-pay | Admitting: Licensed Clinical Social Worker

## 2017-11-08 DIAGNOSIS — Z803 Family history of malignant neoplasm of breast: Secondary | ICD-10-CM

## 2017-11-08 DIAGNOSIS — Z1379 Encounter for other screening for genetic and chromosomal anomalies: Secondary | ICD-10-CM

## 2017-11-08 DIAGNOSIS — Z853 Personal history of malignant neoplasm of breast: Secondary | ICD-10-CM

## 2017-11-08 DIAGNOSIS — Z800 Family history of malignant neoplasm of digestive organs: Secondary | ICD-10-CM

## 2017-11-08 DIAGNOSIS — Z801 Family history of malignant neoplasm of trachea, bronchus and lung: Secondary | ICD-10-CM

## 2017-11-08 NOTE — Progress Notes (Signed)
HPI:  Tammy Fischer was previously seen in the [place] clinic on 10/31/2017 due to a personal and family history of breast cancer and concerns regarding a hereditary predisposition to cancer. Please refer to our prior cancer genetics clinic note for more information regarding Tammy Fischer's medical, social and family histories, and our assessment and recommendations, at the time. Tammy Fischer recent genetic test results were disclosed to her, as well as recommendations warranted by these results. These results and recommendations are discussed in more detail below.   FAMILY HISTORY:  We obtained a detailed, 4-generation family history.  Significant diagnoses are listed below: Family History  Problem Relation Age of Onset  . Lung cancer Mother        d. 32  . Pancreatic cancer Father        d. 48  . Heart Problems Maternal Grandmother   . Heart Problems Maternal Grandfather   . Heart Problems Paternal Grandmother   . Heart Problems Paternal Grandfather   . Breast cancer Other        paternal great aunt, dx. mid 41s   Tammy Fischer has two sons, ages 71 and 35. No cancer history. She does not have grandchildren. Tammy Fischer has a sister, age 22, who has never had cancer but has had 3 benign tumors removed from her breast. Tammy Fischer reports that her sister has had genetic counseling and genetic testing but she does not remember what test she had or the results of it.   Tammy Fischer mother died of lung cancer at 32, she was a smoker. Tammy Fischer has two maternal aunts, no cancer history. One passed away at 90 and the other is living at 74. No cancer history in Tammy Fischer's maternal cousins. Tammy Fischer maternal grandmother passed away of heart/liver failure at 22, and her father died of an unknown cancer at a young age. Tammy Fischer maternal grandfather died of heart issues at 40.  Tammy Fischer father died at 79 of pancreatic cancer. He was an only child. His mother died of  heart issues at 58 and her sister had breast cancer diagnosed in her mid 62s. Tammy Fischer paternal grandfather also died of heart issues at 66.  Tammy Fischer is aware of previous family history of genetic testing for hereditary cancer risks, but does not have the results. Patient's maternal ancestors are of [place] descent, and paternal ancestors are of English descent. There is no reported Ashkenazi Jewish ancestry. There is no known consanguinity.  GENETIC TEST RESULTS: Genetic testing performed through [facility]'s Common Hereditary Cancers + Pancreatic Cancer Panel reported out on 11/08/2017 showed no pathogenic mutations. The [facility] Common Hereditary Cancer Panel + Pancreatic Cancer Panel (49 genes) analyzes the the following genes were evaluated for sequence changes and exonic deletions/duplications: APC, ATM, AXIN2, BARD1, BMPR1A, BRCA1, BRCA2, BRIP1, CDH1, CDK4, CDKN2A (p14ARF), CDKN2A (p16INK4a), CHEK2, CTNNA1, DICER1, EPCAM*, FANCC, GREM1*, KIT, MEN1, MLH1, MSH2, MSH3, MSH6, MUTYH, NBN, NF1, PALB2, PALLD, PDGFRA, PMS2, POLD1, POLE, PTEN, RAD50, RAD51C, RAD51D, SDHB, SDHC, SDHD, SMAD4, SMARCA4, STK11, TP53, TSC1, TSC2, VHL.The following genes were evaluated for sequence changes only: HOXB13*, NTHL1*, SDHA.  The test report will be scanned into EPIC and will be located under the Molecular Pathology section of the Results Review tab. A portion of the result report is included below for reference.    We discussed with Ms. Stare that because current genetic testing is not perfect, it is possible there may be a gene mutation in one of  these genes that current testing cannot detect, but that chance is small.  We also discussed, that there could be another gene that has not yet been discovered, or that we have not yet tested, that is responsible for the cancer diagnoses in the family. It is also possible there is a hereditary cause for the cancer in the family that Ms. Sardina did not inherit and  therefore was not identified in her testing.  Therefore, it is important to remain in touch with cancer genetics in the future so that we can continue to offer Ms. Hatton the most up to date genetic testing.   ADDITIONAL GENETIC TESTING: We discussed with Ms. Tartaglia that there are other genes that are associated with increased cancer risk that can be analyzed. The laboratories that offer this testing look at these additional genes via a hereditary cancer gene panel. Should Ms. Lenger wish to pursue additional genetic testing, we are happy to discuss and coordinate this testing, at any time.    CANCER SCREENING RECOMMENDATIONS: Ms. Clendenin test result is considered negative (normal).  This means that we have not identified a hereditary cause for her personal history of breast cancer and family history of pancreatic and breast cancer at this time.   While reassuring, this does not definitively rule out a hereditary predisposition to cancer. It is still possible that there could be genetic mutations that are undetectable by current technology, or genetic mutations in genes that have not been tested or identified to increase cancer risk.  Therefore, it is recommended she continue to follow the cancer management and screening guidelines provided by her oncology and primary healthcare provider. An individual's cancer risk is not determined by genetic test results alone.  Overall cancer risk assessment includes additional factors such as personal medical history, family history, etc.  These should be used to make a personalized plan for cancer prevention and surveillance.    RECOMMENDATIONS FOR FAMILY MEMBERS:  Relatives in this family might be at some increased risk of developing cancer, over the general population risk, simply due to the family history of cancer.  We recommended women in this family have a yearly mammogram beginning at age 44, or 38 years younger than the earliest onset of cancer, an annual  clinical breast exam, and perform monthly breast self-exams. Women in this family should also have a gynecological exam as recommended by their primary provider. All family members should have a colonoscopy by age 64 (or as directed by their doctors).  All family members should inform their physicians about the family history of cancer so their doctors can make the most appropriate screening recommendations for them.   It is also possible there is a hereditary cause for the cancer in Ms. Drees's family that she did not inherit and therefore was not identified in her.   We recommended her sister have genetic counseling and testing. Ms. Streb will let us know if we can be of any assistance in coordinating genetic counseling and/or testing for these family members.   FOLLOW-UP: Lastly, we discussed with Ms. Wile that cancer genetics is a rapidly advancing field and it is possible that new genetic tests will be appropriate for her and/or her family members in the future. We encouraged her to remain in contact with cancer genetics on an annual basis so we can update her personal and family histories and let her know of advances in cancer genetics that may benefit this family.   Our contact number was provided. Ms.  Ly's questions were answered to her satisfaction, and she knows she is welcome to call us at anytime with additional questions or concerns.   Epimenio Foot, MS Genetic Counselor [email or web address] Phone: [phone]

## 2017-11-08 NOTE — Telephone Encounter (Signed)
Revealed negative genetic testing. We discussed that we do not know why she had breast cancer or why there is cancer in the family. It could be due to a different gene that we are not testing, or something our current technology cannot pick up.  It will be important for her to keep in contact with genetics to learn if additional testing may be needed in the future.  

## 2018-01-16 ENCOUNTER — Other Ambulatory Visit: Payer: Self-pay | Admitting: Family Medicine

## 2018-01-16 DIAGNOSIS — Z1231 Encounter for screening mammogram for malignant neoplasm of breast: Secondary | ICD-10-CM

## 2018-02-28 ENCOUNTER — Ambulatory Visit
Admission: RE | Admit: 2018-02-28 | Discharge: 2018-02-28 | Disposition: A | Discharge status: Home / Self Care | Payer: Medicare Other | Source: Ambulatory Visit | Attending: Family Medicine | Admitting: Family Medicine

## 2018-02-28 DIAGNOSIS — Z1231 Encounter for screening mammogram for malignant neoplasm of breast: Secondary | ICD-10-CM

## 2018-02-28 HISTORY — DX: Malignant neoplasm of unspecified site of unspecified female breast: C50.919

## 2018-02-28 HISTORY — DX: Personal history of antineoplastic chemotherapy: Z92.21

## 2018-02-28 HISTORY — DX: Personal history of irradiation: Z92.3

## 2019-01-23 ENCOUNTER — Other Ambulatory Visit: Payer: Self-pay | Admitting: Family Medicine

## 2019-01-23 DIAGNOSIS — Z1231 Encounter for screening mammogram for malignant neoplasm of breast: Secondary | ICD-10-CM

## 2019-03-21 ENCOUNTER — Ambulatory Visit
Admission: RE | Admit: 2019-03-21 | Discharge: 2019-03-21 | Disposition: A | Discharge status: Home / Self Care | Payer: Medicare PPO | Source: Ambulatory Visit | Attending: Family Medicine | Admitting: Family Medicine

## 2019-03-21 ENCOUNTER — Other Ambulatory Visit: Payer: Self-pay

## 2019-03-21 DIAGNOSIS — Z1231 Encounter for screening mammogram for malignant neoplasm of breast: Secondary | ICD-10-CM

## 2019-04-06 ENCOUNTER — Other Ambulatory Visit: Payer: Self-pay

## 2019-04-06 ENCOUNTER — Ambulatory Visit: Payer: Medicare PPO | Attending: Internal Medicine

## 2019-04-06 DIAGNOSIS — Z20822 Contact with and (suspected) exposure to covid-19: Secondary | ICD-10-CM

## 2019-04-07 LAB — NOVEL CORONAVIRUS, NAA: SARS-CoV-2, NAA: NOT DETECTED

## 2019-04-16 ENCOUNTER — Ambulatory Visit: Payer: Medicare PPO

## 2019-04-22 ENCOUNTER — Ambulatory Visit: Payer: Medicare PPO | Attending: Internal Medicine

## 2019-04-22 DIAGNOSIS — Z23 Encounter for immunization: Secondary | ICD-10-CM

## 2019-04-22 NOTE — Progress Notes (Signed)
   Covid-19 Vaccination Clinic  Name:  Nadeya Denunzio    MRN: [id number] DOB: [birth year]  04/22/2019  Ms. Deliz was observed post Covid-19 immunization for 15 minutes without incidence. She was provided with Vaccine Information Sheet and instruction to access the V-Safe system.   Ms. Itkin was instructed to call 911 with any severe reactions post vaccine: Marland Kitchen Difficulty breathing  . Swelling of your face and throat  . A fast heartbeat  . A bad rash all over your body  . Dizziness and weakness    Immunizations Administered    Name Date Dose VIS Date Route   Pfizer COVID-19 Vaccine 04/22/2019 12:22 PM 0.3 mL 02/23/2019 Intramuscular   Manufacturer: [place]   Lot: [id number]   Vian: [id number]

## 2019-05-16 ENCOUNTER — Ambulatory Visit: Payer: Medicare PPO

## 2019-05-16 ENCOUNTER — Ambulatory Visit: Payer: Medicare PPO | Attending: Internal Medicine

## 2019-05-16 DIAGNOSIS — Z23 Encounter for immunization: Secondary | ICD-10-CM | POA: Insufficient documentation

## 2019-05-16 NOTE — Progress Notes (Signed)
   Covid-19 Vaccination Clinic  Name:  Ilyn Shortt    MRN: [id number] DOB: 10/16/1950  05/16/2019  Ms. Brackin was observed post Covid-19 immunization for 15 minutes without incident. She was provided with Vaccine Information Sheet and instruction to access the V-Safe system.   Ms. Mcnalley was instructed to call 911 with any severe reactions post vaccine: Marland Kitchen Difficulty breathing  . Swelling of face and throat  . A fast heartbeat  . A bad rash all over body  . Dizziness and weakness   Immunizations Administered    Name Date Dose VIS Date Route   Pfizer COVID-19 Vaccine 05/16/2019 12:10 PM 0.3 mL 02/23/2019 Intramuscular   Manufacturer: [place]   Lot: [id number]   [place]: [id number]

## 2019-11-13 ENCOUNTER — Ambulatory Visit: Payer: Medicare PPO | Attending: Internal Medicine

## 2019-11-13 DIAGNOSIS — Z23 Encounter for immunization: Secondary | ICD-10-CM

## 2019-11-13 NOTE — Progress Notes (Signed)
° °  Covid-19 Vaccination Clinic  Name:  Soila Printup    MRN: [id number] DOB: February 14, 1951  11/13/2019  Ms. Hayden was observed post Covid-19 immunization for 15 minutes without incident. She was provided with Vaccine Information Sheet and instruction to access the V-Safe system.   Ms. Rivenbark was instructed to call 911 with any severe reactions post vaccine:  Difficulty breathing   Swelling of face and throat   A fast heartbeat   A bad rash all over body   Dizziness and weakness

## 2020-02-05 ENCOUNTER — Other Ambulatory Visit: Payer: Self-pay | Admitting: Family Medicine

## 2020-02-05 DIAGNOSIS — Z1231 Encounter for screening mammogram for malignant neoplasm of breast: Secondary | ICD-10-CM

## 2020-03-25 ENCOUNTER — Other Ambulatory Visit: Payer: Self-pay

## 2020-03-25 ENCOUNTER — Ambulatory Visit
Admission: RE | Admit: 2020-03-25 | Discharge: 2020-03-25 | Disposition: A | Discharge status: Home / Self Care | Payer: Medicare PPO | Source: Ambulatory Visit | Attending: Family Medicine | Admitting: Family Medicine

## 2020-03-25 DIAGNOSIS — Z1231 Encounter for screening mammogram for malignant neoplasm of breast: Secondary | ICD-10-CM

## 2020-03-25 IMAGING — MG DIGITAL SCREENING BILAT W/ TOMO W/ CAD
8 series · 9 of 24 positions shown · non-contrast
Comparison: Previous exam(s).

CLINICAL DATA: Screening.

EXAM:
DIGITAL SCREENING BILATERAL MAMMOGRAM WITH TOMO AND CAD

[L MLO synth-2D]
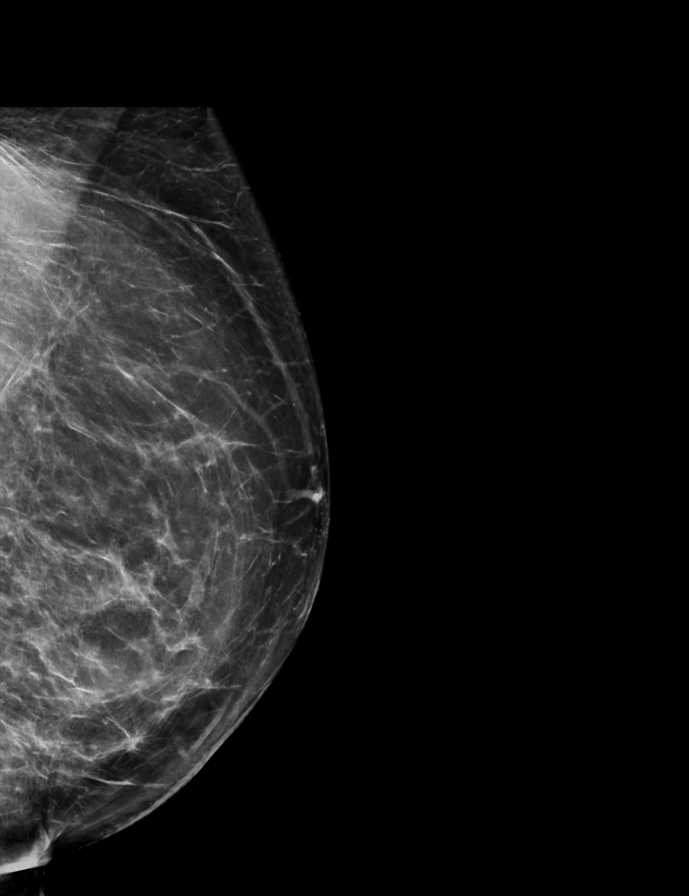

[R MLO synth-2D]
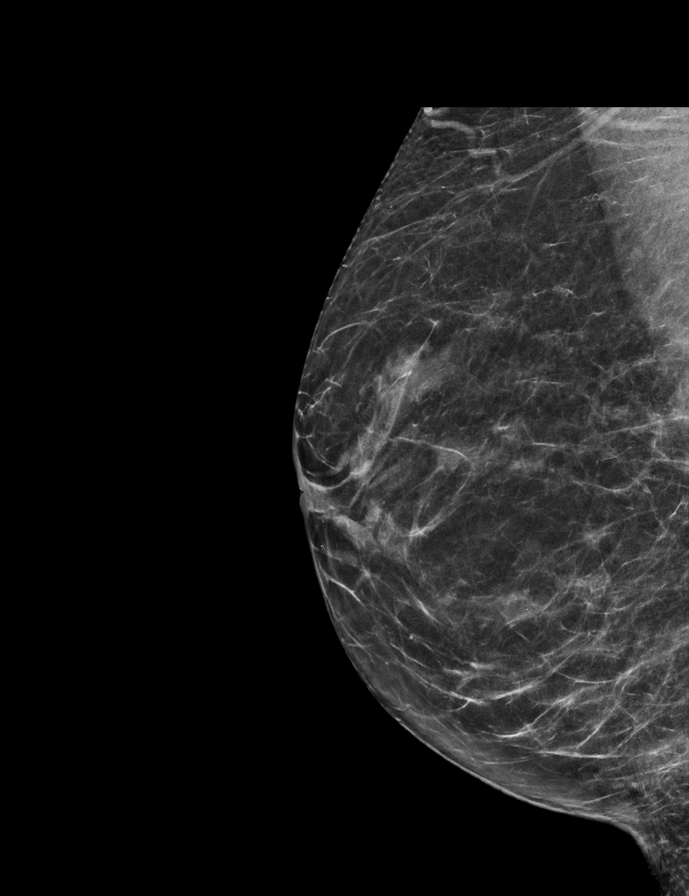

[R CC synth-2D]
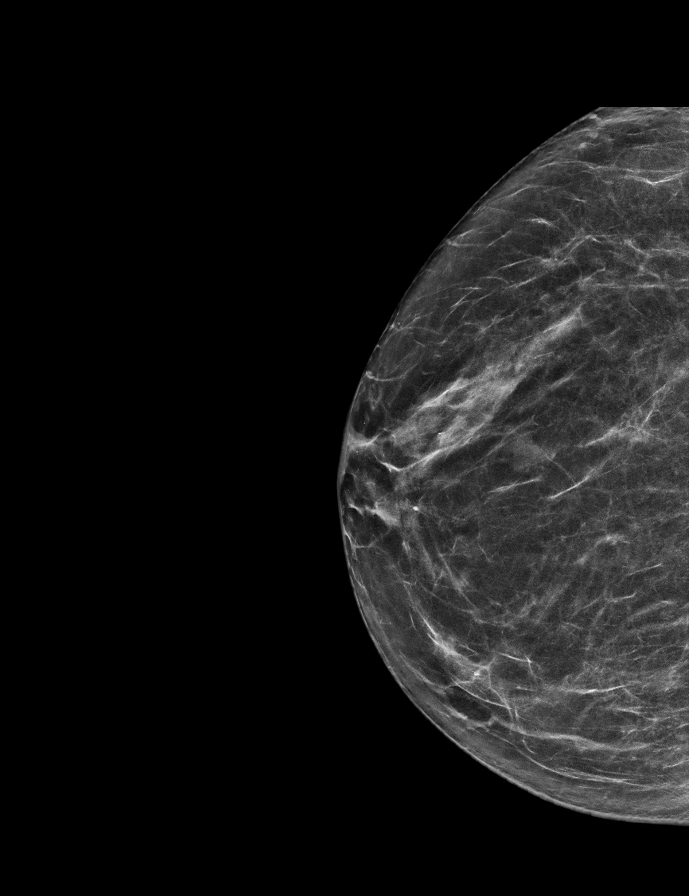

[L CC synth-2D]
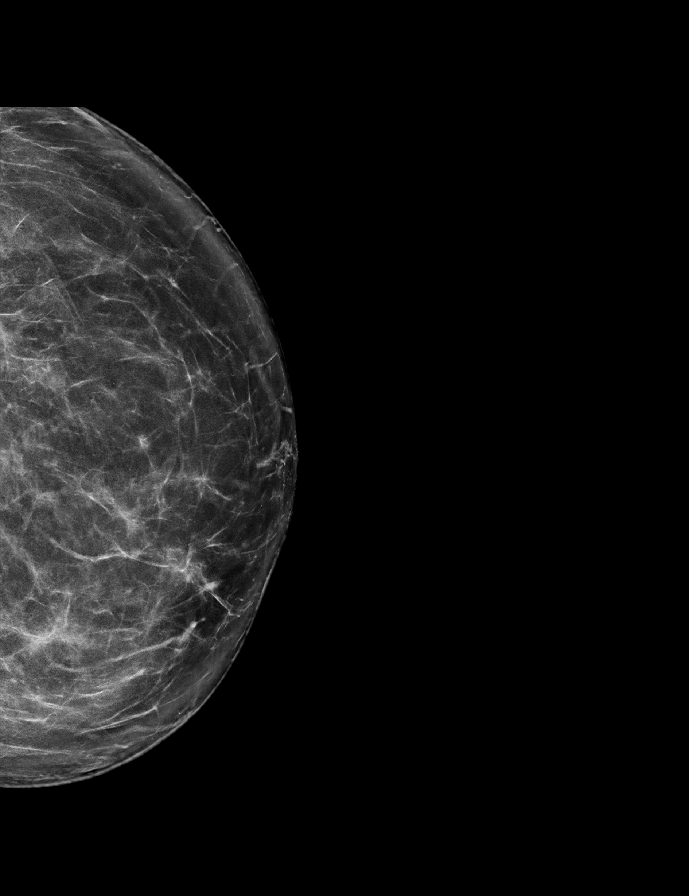

[R MLO tomo · 2 of 60 frames shown]
[frame 20/60]
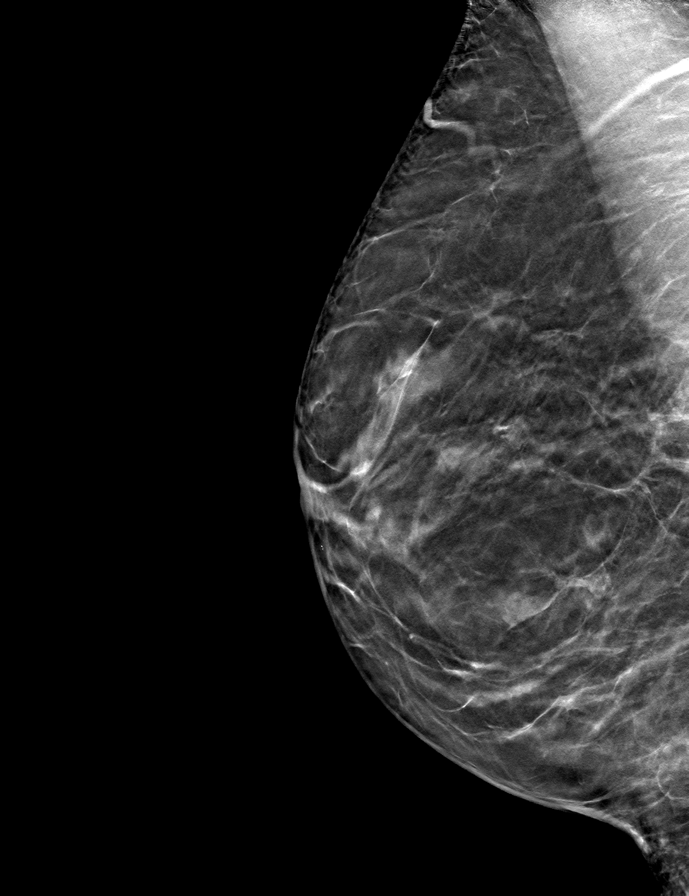
[frame 31/60]
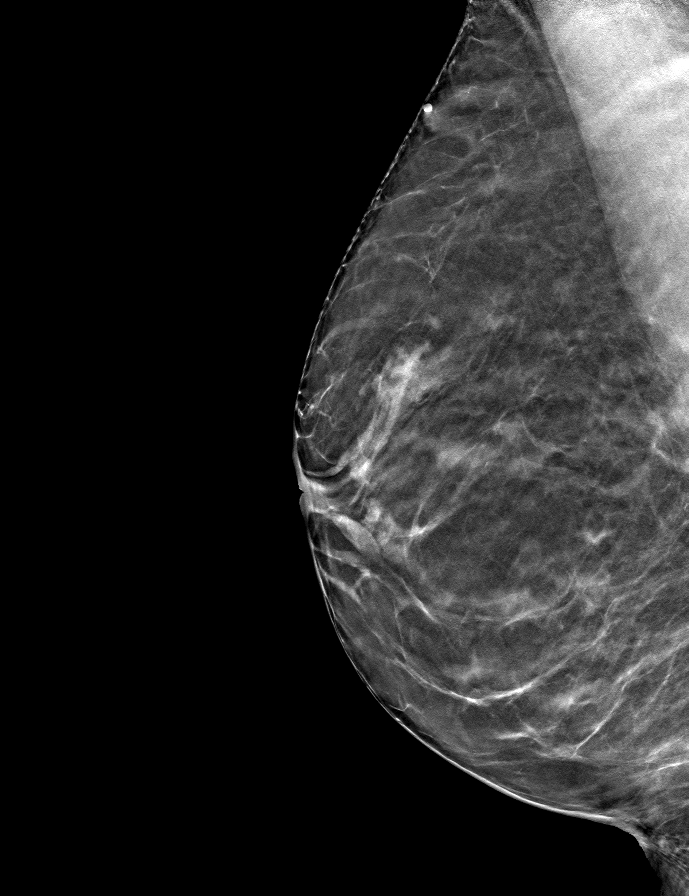

[R CC tomo · tomo slice 32/63.0]
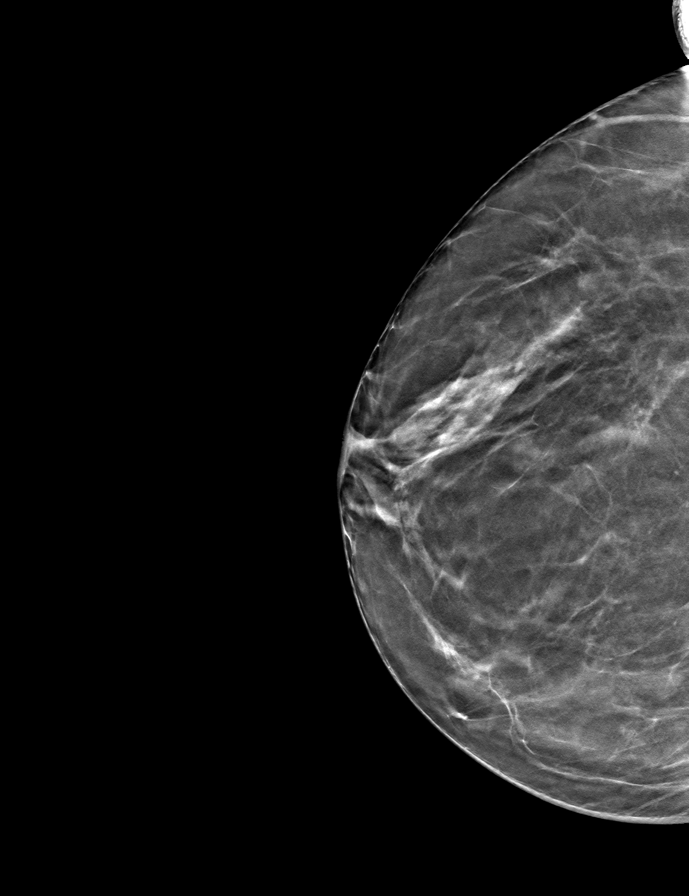

[L MLO tomo · tomo slice 39/78.0]
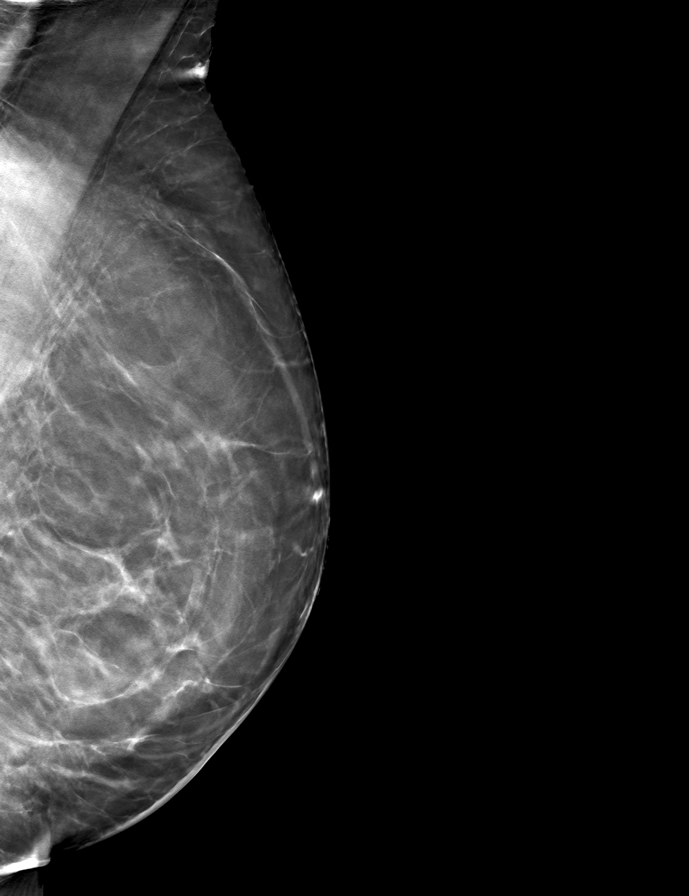

[L CC tomo · tomo slice 39/77.0]
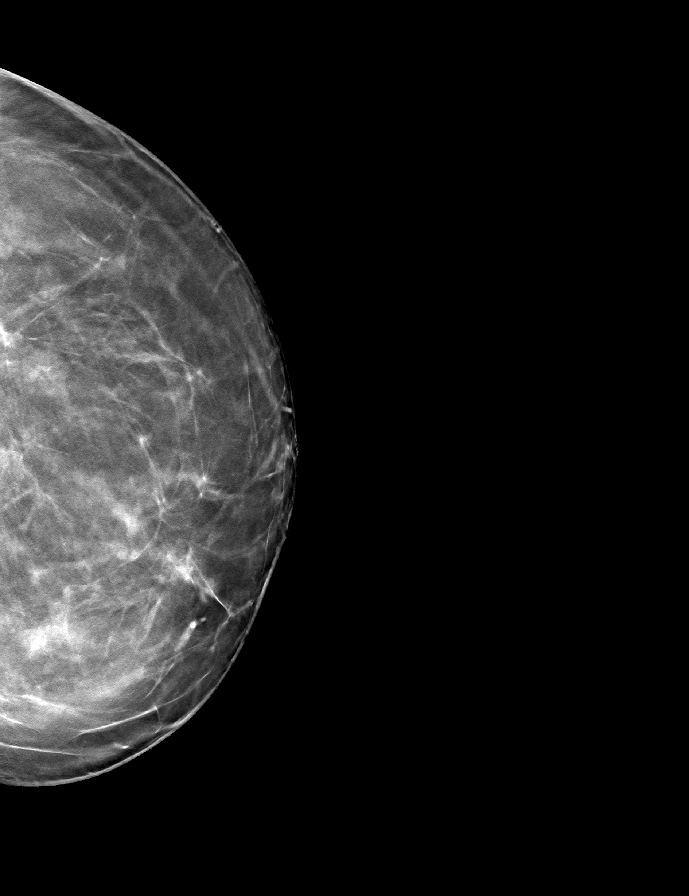

[9 of 24 positions shown; findings below may reference images not displayed]

ACR Breast Density Category b: There are scattered areas of
fibroglandular density.
FINDINGS: There are no findings suspicious for malignancy. Images were
processed with CAD.
IMPRESSION: No mammographic evidence of malignancy. A result letter of this
screening mammogram will be mailed directly to the patient.

RECOMMENDATION:
Screening mammogram in one year. (Code:[TQ])

BI-RADS CATEGORY  1: Negative.

## 2020-04-11 DIAGNOSIS — H5213 Myopia, bilateral: Secondary | ICD-10-CM | POA: Diagnosis not present

## 2020-07-30 DIAGNOSIS — H40052 Ocular hypertension, left eye: Secondary | ICD-10-CM | POA: Diagnosis not present

## 2020-09-16 DIAGNOSIS — L814 Other melanin hyperpigmentation: Secondary | ICD-10-CM | POA: Diagnosis not present

## 2020-09-16 DIAGNOSIS — D2272 Melanocytic nevi of left lower limb, including hip: Secondary | ICD-10-CM | POA: Diagnosis not present

## 2020-09-16 DIAGNOSIS — D692 Other nonthrombocytopenic purpura: Secondary | ICD-10-CM | POA: Diagnosis not present

## 2020-09-16 DIAGNOSIS — L84 Corns and callosities: Secondary | ICD-10-CM | POA: Diagnosis not present

## 2020-10-23 DIAGNOSIS — G479 Sleep disorder, unspecified: Secondary | ICD-10-CM | POA: Diagnosis not present

## 2020-10-23 DIAGNOSIS — M25551 Pain in right hip: Secondary | ICD-10-CM | POA: Diagnosis not present

## 2020-10-28 DIAGNOSIS — M25551 Pain in right hip: Secondary | ICD-10-CM | POA: Diagnosis not present

## 2020-11-25 DIAGNOSIS — H409 Unspecified glaucoma: Secondary | ICD-10-CM | POA: Diagnosis not present

## 2020-11-25 DIAGNOSIS — I739 Peripheral vascular disease, unspecified: Secondary | ICD-10-CM | POA: Diagnosis not present

## 2020-11-25 DIAGNOSIS — G8929 Other chronic pain: Secondary | ICD-10-CM | POA: Diagnosis not present

## 2020-11-25 DIAGNOSIS — M2550 Pain in unspecified joint: Secondary | ICD-10-CM | POA: Diagnosis not present

## 2020-11-25 DIAGNOSIS — R030 Elevated blood-pressure reading, without diagnosis of hypertension: Secondary | ICD-10-CM | POA: Diagnosis not present

## 2020-11-25 DIAGNOSIS — M8580 Other specified disorders of bone density and structure, unspecified site: Secondary | ICD-10-CM | POA: Diagnosis not present

## 2020-11-25 DIAGNOSIS — Z7722 Contact with and (suspected) exposure to environmental tobacco smoke (acute) (chronic): Secondary | ICD-10-CM | POA: Diagnosis not present

## 2020-11-25 DIAGNOSIS — G629 Polyneuropathy, unspecified: Secondary | ICD-10-CM | POA: Diagnosis not present

## 2020-11-25 DIAGNOSIS — E785 Hyperlipidemia, unspecified: Secondary | ICD-10-CM | POA: Diagnosis not present

## 2020-11-26 DIAGNOSIS — H40052 Ocular hypertension, left eye: Secondary | ICD-10-CM | POA: Diagnosis not present

## 2021-01-05 DIAGNOSIS — R0989 Other specified symptoms and signs involving the circulatory and respiratory systems: Secondary | ICD-10-CM | POA: Diagnosis not present

## 2021-01-14 DIAGNOSIS — R0989 Other specified symptoms and signs involving the circulatory and respiratory systems: Secondary | ICD-10-CM | POA: Diagnosis not present

## 2021-01-14 DIAGNOSIS — E785 Hyperlipidemia, unspecified: Secondary | ICD-10-CM | POA: Diagnosis not present

## 2021-01-21 ENCOUNTER — Other Ambulatory Visit: Payer: Self-pay | Admitting: Family Medicine

## 2021-01-21 DIAGNOSIS — E785 Hyperlipidemia, unspecified: Secondary | ICD-10-CM | POA: Diagnosis not present

## 2021-01-21 DIAGNOSIS — H409 Unspecified glaucoma: Secondary | ICD-10-CM | POA: Diagnosis not present

## 2021-01-21 DIAGNOSIS — Z853 Personal history of malignant neoplasm of breast: Secondary | ICD-10-CM | POA: Diagnosis not present

## 2021-01-21 DIAGNOSIS — G479 Sleep disorder, unspecified: Secondary | ICD-10-CM | POA: Diagnosis not present

## 2021-01-21 DIAGNOSIS — Z0000 Encounter for general adult medical examination without abnormal findings: Secondary | ICD-10-CM | POA: Diagnosis not present

## 2021-01-21 DIAGNOSIS — R5383 Other fatigue: Secondary | ICD-10-CM | POA: Diagnosis not present

## 2021-01-21 DIAGNOSIS — Z1389 Encounter for screening for other disorder: Secondary | ICD-10-CM | POA: Diagnosis not present

## 2021-01-21 DIAGNOSIS — G43909 Migraine, unspecified, not intractable, without status migrainosus: Secondary | ICD-10-CM | POA: Diagnosis not present

## 2021-01-21 DIAGNOSIS — M8580 Other specified disorders of bone density and structure, unspecified site: Secondary | ICD-10-CM | POA: Diagnosis not present

## 2021-01-21 DIAGNOSIS — M810 Age-related osteoporosis without current pathological fracture: Secondary | ICD-10-CM

## 2021-02-15 DIAGNOSIS — R051 Acute cough: Secondary | ICD-10-CM | POA: Diagnosis not present

## 2021-02-15 DIAGNOSIS — J209 Acute bronchitis, unspecified: Secondary | ICD-10-CM | POA: Diagnosis not present

## 2021-02-27 DIAGNOSIS — R1011 Right upper quadrant pain: Secondary | ICD-10-CM | POA: Diagnosis not present

## 2021-04-09 DIAGNOSIS — H5213 Myopia, bilateral: Secondary | ICD-10-CM | POA: Diagnosis not present

## 2021-04-28 ENCOUNTER — Other Ambulatory Visit: Payer: Self-pay | Admitting: Family Medicine

## 2021-04-28 ENCOUNTER — Ambulatory Visit
Admission: RE | Admit: 2021-04-28 | Discharge: 2021-04-28 | Disposition: A | Discharge status: Home / Self Care | Payer: Medicare PPO | Source: Ambulatory Visit | Attending: Family Medicine | Admitting: Family Medicine

## 2021-04-28 DIAGNOSIS — Z1231 Encounter for screening mammogram for malignant neoplasm of breast: Secondary | ICD-10-CM | POA: Diagnosis not present

## 2021-07-16 ENCOUNTER — Ambulatory Visit
Admission: RE | Admit: 2021-07-16 | Discharge: 2021-07-16 | Disposition: A | Discharge status: Home / Self Care | Payer: Medicare PPO | Source: Ambulatory Visit | Attending: Family Medicine | Admitting: Family Medicine

## 2021-07-16 DIAGNOSIS — M8589 Other specified disorders of bone density and structure, multiple sites: Secondary | ICD-10-CM | POA: Diagnosis not present

## 2021-07-16 DIAGNOSIS — M810 Age-related osteoporosis without current pathological fracture: Secondary | ICD-10-CM

## 2021-07-16 DIAGNOSIS — Z780 Asymptomatic menopausal state: Secondary | ICD-10-CM | POA: Diagnosis not present

## 2021-07-30 DIAGNOSIS — H40052 Ocular hypertension, left eye: Secondary | ICD-10-CM | POA: Diagnosis not present

## 2021-09-23 DIAGNOSIS — L570 Actinic keratosis: Secondary | ICD-10-CM | POA: Diagnosis not present

## 2021-09-23 DIAGNOSIS — D2272 Melanocytic nevi of left lower limb, including hip: Secondary | ICD-10-CM | POA: Diagnosis not present

## 2021-09-23 DIAGNOSIS — L814 Other melanin hyperpigmentation: Secondary | ICD-10-CM | POA: Diagnosis not present

## 2021-11-25 DIAGNOSIS — Z1211 Encounter for screening for malignant neoplasm of colon: Secondary | ICD-10-CM | POA: Diagnosis not present

## 2021-11-25 DIAGNOSIS — K648 Other hemorrhoids: Secondary | ICD-10-CM | POA: Diagnosis not present

## 2021-11-26 DIAGNOSIS — H40052 Ocular hypertension, left eye: Secondary | ICD-10-CM | POA: Diagnosis not present

## 2022-01-26 DIAGNOSIS — M8580 Other specified disorders of bone density and structure, unspecified site: Secondary | ICD-10-CM | POA: Diagnosis not present

## 2022-01-26 DIAGNOSIS — H409 Unspecified glaucoma: Secondary | ICD-10-CM | POA: Diagnosis not present

## 2022-01-26 DIAGNOSIS — M47812 Spondylosis without myelopathy or radiculopathy, cervical region: Secondary | ICD-10-CM | POA: Diagnosis not present

## 2022-01-26 DIAGNOSIS — R5383 Other fatigue: Secondary | ICD-10-CM | POA: Diagnosis not present

## 2022-01-26 DIAGNOSIS — G479 Sleep disorder, unspecified: Secondary | ICD-10-CM | POA: Diagnosis not present

## 2022-01-26 DIAGNOSIS — Z0000 Encounter for general adult medical examination without abnormal findings: Secondary | ICD-10-CM | POA: Diagnosis not present

## 2022-01-26 DIAGNOSIS — E785 Hyperlipidemia, unspecified: Secondary | ICD-10-CM | POA: Diagnosis not present

## 2022-01-26 DIAGNOSIS — G43909 Migraine, unspecified, not intractable, without status migrainosus: Secondary | ICD-10-CM | POA: Diagnosis not present

## 2022-01-26 DIAGNOSIS — Z853 Personal history of malignant neoplasm of breast: Secondary | ICD-10-CM | POA: Diagnosis not present

## 2022-01-28 DIAGNOSIS — M8580 Other specified disorders of bone density and structure, unspecified site: Secondary | ICD-10-CM | POA: Diagnosis not present

## 2022-01-28 DIAGNOSIS — E785 Hyperlipidemia, unspecified: Secondary | ICD-10-CM | POA: Diagnosis not present

## 2022-01-28 DIAGNOSIS — G43909 Migraine, unspecified, not intractable, without status migrainosus: Secondary | ICD-10-CM | POA: Diagnosis not present

## 2022-01-28 DIAGNOSIS — Z0000 Encounter for general adult medical examination without abnormal findings: Secondary | ICD-10-CM | POA: Diagnosis not present

## 2022-01-28 DIAGNOSIS — Z853 Personal history of malignant neoplasm of breast: Secondary | ICD-10-CM | POA: Diagnosis not present

## 2022-03-17 ENCOUNTER — Other Ambulatory Visit: Payer: Self-pay | Admitting: Family Medicine

## 2022-03-17 DIAGNOSIS — Z1231 Encounter for screening mammogram for malignant neoplasm of breast: Secondary | ICD-10-CM

## 2022-05-07 ENCOUNTER — Ambulatory Visit
Admission: RE | Admit: 2022-05-07 | Discharge: 2022-05-07 | Disposition: A | Discharge status: Home / Self Care | Payer: Medicare PPO | Source: Ambulatory Visit | Attending: Family Medicine | Admitting: Family Medicine

## 2022-05-07 DIAGNOSIS — Z1231 Encounter for screening mammogram for malignant neoplasm of breast: Secondary | ICD-10-CM

## 2022-07-22 DIAGNOSIS — M7060 Trochanteric bursitis, unspecified hip: Secondary | ICD-10-CM | POA: Diagnosis not present

## 2022-07-27 DIAGNOSIS — M7062 Trochanteric bursitis, left hip: Secondary | ICD-10-CM | POA: Diagnosis not present

## 2022-08-05 DIAGNOSIS — H40052 Ocular hypertension, left eye: Secondary | ICD-10-CM | POA: Diagnosis not present

## 2022-08-23 DIAGNOSIS — N898 Other specified noninflammatory disorders of vagina: Secondary | ICD-10-CM | POA: Diagnosis not present

## 2022-08-23 DIAGNOSIS — R300 Dysuria: Secondary | ICD-10-CM | POA: Diagnosis not present

## 2022-08-23 DIAGNOSIS — Z853 Personal history of malignant neoplasm of breast: Secondary | ICD-10-CM | POA: Diagnosis not present

## 2022-09-28 DIAGNOSIS — S40862A Insect bite (nonvenomous) of left upper arm, initial encounter: Secondary | ICD-10-CM | POA: Diagnosis not present

## 2022-09-28 DIAGNOSIS — L814 Other melanin hyperpigmentation: Secondary | ICD-10-CM | POA: Diagnosis not present

## 2022-09-28 DIAGNOSIS — D2272 Melanocytic nevi of left lower limb, including hip: Secondary | ICD-10-CM | POA: Diagnosis not present

## 2022-12-30 DIAGNOSIS — E785 Hyperlipidemia, unspecified: Secondary | ICD-10-CM | POA: Diagnosis not present

## 2022-12-30 DIAGNOSIS — I739 Peripheral vascular disease, unspecified: Secondary | ICD-10-CM | POA: Diagnosis not present

## 2022-12-30 DIAGNOSIS — Z8249 Family history of ischemic heart disease and other diseases of the circulatory system: Secondary | ICD-10-CM | POA: Diagnosis not present

## 2022-12-30 DIAGNOSIS — M8580 Other specified disorders of bone density and structure, unspecified site: Secondary | ICD-10-CM | POA: Diagnosis not present

## 2022-12-30 DIAGNOSIS — Z853 Personal history of malignant neoplasm of breast: Secondary | ICD-10-CM | POA: Diagnosis not present

## 2022-12-30 DIAGNOSIS — G629 Polyneuropathy, unspecified: Secondary | ICD-10-CM | POA: Diagnosis not present

## 2022-12-30 DIAGNOSIS — Z809 Family history of malignant neoplasm, unspecified: Secondary | ICD-10-CM | POA: Diagnosis not present

## 2022-12-30 DIAGNOSIS — H409 Unspecified glaucoma: Secondary | ICD-10-CM | POA: Diagnosis not present

## 2023-01-28 ENCOUNTER — Other Ambulatory Visit: Payer: Self-pay | Admitting: Internal Medicine

## 2023-01-28 DIAGNOSIS — I739 Peripheral vascular disease, unspecified: Secondary | ICD-10-CM | POA: Diagnosis not present

## 2023-01-28 DIAGNOSIS — M47812 Spondylosis without myelopathy or radiculopathy, cervical region: Secondary | ICD-10-CM | POA: Diagnosis not present

## 2023-01-28 DIAGNOSIS — Z853 Personal history of malignant neoplasm of breast: Secondary | ICD-10-CM | POA: Diagnosis not present

## 2023-01-28 DIAGNOSIS — E785 Hyperlipidemia, unspecified: Secondary | ICD-10-CM | POA: Diagnosis not present

## 2023-01-28 DIAGNOSIS — H409 Unspecified glaucoma: Secondary | ICD-10-CM | POA: Diagnosis not present

## 2023-01-28 DIAGNOSIS — G479 Sleep disorder, unspecified: Secondary | ICD-10-CM | POA: Diagnosis not present

## 2023-01-28 DIAGNOSIS — R5383 Other fatigue: Secondary | ICD-10-CM | POA: Diagnosis not present

## 2023-01-28 DIAGNOSIS — Z0000 Encounter for general adult medical examination without abnormal findings: Secondary | ICD-10-CM | POA: Diagnosis not present

## 2023-01-28 DIAGNOSIS — G43909 Migraine, unspecified, not intractable, without status migrainosus: Secondary | ICD-10-CM | POA: Diagnosis not present

## 2023-01-28 DIAGNOSIS — M8580 Other specified disorders of bone density and structure, unspecified site: Secondary | ICD-10-CM | POA: Diagnosis not present

## 2023-01-31 ENCOUNTER — Ambulatory Visit
Admission: RE | Admit: 2023-01-31 | Discharge: 2023-01-31 | Disposition: A | Discharge status: Home / Self Care | Payer: Medicare PPO | Source: Ambulatory Visit | Attending: Internal Medicine | Admitting: Internal Medicine

## 2023-01-31 DIAGNOSIS — I739 Peripheral vascular disease, unspecified: Secondary | ICD-10-CM

## 2023-02-02 DIAGNOSIS — M545 Low back pain, unspecified: Secondary | ICD-10-CM | POA: Diagnosis not present

## 2023-02-02 DIAGNOSIS — M25551 Pain in right hip: Secondary | ICD-10-CM | POA: Diagnosis not present

## 2023-02-02 DIAGNOSIS — M25552 Pain in left hip: Secondary | ICD-10-CM | POA: Diagnosis not present

## 2023-02-14 ENCOUNTER — Other Ambulatory Visit: Payer: Self-pay | Admitting: Medical Genetics

## 2023-02-15 DIAGNOSIS — R0683 Snoring: Secondary | ICD-10-CM | POA: Diagnosis not present

## 2023-03-29 ENCOUNTER — Other Ambulatory Visit (HOSPITAL_COMMUNITY)
Admission: RE | Admit: 2023-03-29 | Discharge: 2023-03-29 | Disposition: A | Discharge status: Home / Self Care | Payer: Self-pay | Source: Ambulatory Visit | Attending: Oncology | Admitting: Oncology

## 2023-03-30 DIAGNOSIS — G4710 Hypersomnia, unspecified: Secondary | ICD-10-CM | POA: Diagnosis not present

## 2023-03-30 DIAGNOSIS — R0683 Snoring: Secondary | ICD-10-CM | POA: Diagnosis not present

## 2023-04-06 ENCOUNTER — Other Ambulatory Visit: Payer: Self-pay | Admitting: Internal Medicine

## 2023-04-06 DIAGNOSIS — Z1231 Encounter for screening mammogram for malignant neoplasm of breast: Secondary | ICD-10-CM

## 2023-04-08 DIAGNOSIS — H524 Presbyopia: Secondary | ICD-10-CM | POA: Diagnosis not present

## 2023-04-08 LAB — GENECONNECT MOLECULAR SCREEN: Genetic Analysis Overall Interpretation: NEGATIVE

## 2023-05-10 ENCOUNTER — Ambulatory Visit
Admission: RE | Admit: 2023-05-10 | Discharge: 2023-05-10 | Disposition: A | Discharge status: Home / Self Care | Payer: Medicare PPO | Source: Ambulatory Visit | Attending: Internal Medicine

## 2023-05-10 ENCOUNTER — Ambulatory Visit: Payer: Medicare PPO

## 2023-05-10 DIAGNOSIS — Z1231 Encounter for screening mammogram for malignant neoplasm of breast: Secondary | ICD-10-CM

## 2023-05-11 DIAGNOSIS — Z23 Encounter for immunization: Secondary | ICD-10-CM | POA: Diagnosis not present

## 2023-06-09 DIAGNOSIS — Z23 Encounter for immunization: Secondary | ICD-10-CM | POA: Diagnosis not present

## 2023-09-29 DIAGNOSIS — H401122 Primary open-angle glaucoma, left eye, moderate stage: Secondary | ICD-10-CM | POA: Diagnosis not present

## 2023-09-30 DIAGNOSIS — L720 Epidermal cyst: Secondary | ICD-10-CM | POA: Diagnosis not present

## 2023-09-30 DIAGNOSIS — D692 Other nonthrombocytopenic purpura: Secondary | ICD-10-CM | POA: Diagnosis not present

## 2023-09-30 DIAGNOSIS — D2272 Melanocytic nevi of left lower limb, including hip: Secondary | ICD-10-CM | POA: Diagnosis not present

## 2023-09-30 DIAGNOSIS — D225 Melanocytic nevi of trunk: Secondary | ICD-10-CM | POA: Diagnosis not present

## 2023-09-30 DIAGNOSIS — D2271 Melanocytic nevi of right lower limb, including hip: Secondary | ICD-10-CM | POA: Diagnosis not present

## 2023-09-30 DIAGNOSIS — L570 Actinic keratosis: Secondary | ICD-10-CM | POA: Diagnosis not present

## 2023-10-06 ENCOUNTER — Other Ambulatory Visit: Payer: Self-pay

## 2023-10-06 ENCOUNTER — Observation Stay (HOSPITAL_COMMUNITY)
Admission: EM | Admit: 2023-10-06 | Discharge: 2023-10-08 | Disposition: A | Discharge status: Home / Self Care | Attending: Family Medicine | Admitting: Family Medicine

## 2023-10-06 ENCOUNTER — Observation Stay (HOSPITAL_COMMUNITY)

## 2023-10-06 ENCOUNTER — Encounter (HOSPITAL_COMMUNITY): Payer: Self-pay

## 2023-10-06 ENCOUNTER — Emergency Department (HOSPITAL_COMMUNITY)

## 2023-10-06 DIAGNOSIS — R4701 Aphasia: Secondary | ICD-10-CM | POA: Insufficient documentation

## 2023-10-06 DIAGNOSIS — R269 Unspecified abnormalities of gait and mobility: Secondary | ICD-10-CM | POA: Insufficient documentation

## 2023-10-06 DIAGNOSIS — Z79899 Other long term (current) drug therapy: Secondary | ICD-10-CM | POA: Diagnosis not present

## 2023-10-06 DIAGNOSIS — Z7982 Long term (current) use of aspirin: Secondary | ICD-10-CM | POA: Diagnosis not present

## 2023-10-06 DIAGNOSIS — I6782 Cerebral ischemia: Secondary | ICD-10-CM | POA: Diagnosis not present

## 2023-10-06 DIAGNOSIS — R4182 Altered mental status, unspecified: Secondary | ICD-10-CM | POA: Diagnosis not present

## 2023-10-06 DIAGNOSIS — R55 Syncope and collapse: Principal | ICD-10-CM | POA: Diagnosis present

## 2023-10-06 DIAGNOSIS — E785 Hyperlipidemia, unspecified: Secondary | ICD-10-CM | POA: Insufficient documentation

## 2023-10-06 DIAGNOSIS — R410 Disorientation, unspecified: Secondary | ICD-10-CM | POA: Diagnosis not present

## 2023-10-06 DIAGNOSIS — H409 Unspecified glaucoma: Secondary | ICD-10-CM | POA: Insufficient documentation

## 2023-10-06 DIAGNOSIS — Z853 Personal history of malignant neoplasm of breast: Secondary | ICD-10-CM | POA: Diagnosis not present

## 2023-10-06 DIAGNOSIS — G43109 Migraine with aura, not intractable, without status migrainosus: Secondary | ICD-10-CM | POA: Diagnosis not present

## 2023-10-06 DIAGNOSIS — R231 Pallor: Secondary | ICD-10-CM | POA: Diagnosis not present

## 2023-10-06 DIAGNOSIS — R29818 Other symptoms and signs involving the nervous system: Secondary | ICD-10-CM | POA: Diagnosis not present

## 2023-10-06 DIAGNOSIS — R42 Dizziness and giddiness: Secondary | ICD-10-CM | POA: Diagnosis present

## 2023-10-06 DIAGNOSIS — R569 Unspecified convulsions: Secondary | ICD-10-CM | POA: Diagnosis not present

## 2023-10-06 DIAGNOSIS — R531 Weakness: Secondary | ICD-10-CM | POA: Diagnosis not present

## 2023-10-06 LAB — CBC
HCT: 40.8 % (ref 36.0–46.0)
Hemoglobin: 14.2 g/dL (ref 12.0–15.0)
MCH: 33.1 pg (ref 26.0–34.0)
MCHC: 34.8 g/dL (ref 30.0–36.0)
MCV: 95.1 fL (ref 80.0–100.0)
Platelets: 195 K/uL (ref 150–400)
RBC: 4.29 MIL/uL (ref 3.87–5.11)
RDW: 13.2 % (ref 11.5–15.5)
WBC: 7.6 K/uL (ref 4.0–10.5)
nRBC: 0 % (ref 0.0–0.2)

## 2023-10-06 LAB — I-STAT CHEM 8, ED
BUN: 18 mg/dL (ref 8–23)
Calcium, Ion: 1.17 mmol/L (ref 1.15–1.40)
Chloride: 105 mmol/L (ref 98–111)
Creatinine, Ser: 0.8 mg/dL (ref 0.44–1.00)
Glucose, Bld: 138 mg/dL — ABNORMAL HIGH (ref 70–99)
HCT: 44 % (ref 36.0–46.0)
Hemoglobin: 15 g/dL (ref 12.0–15.0)
Potassium: 3.6 mmol/L (ref 3.5–5.1)
Sodium: 138 mmol/L (ref 135–145)
TCO2: 20 mmol/L — ABNORMAL LOW (ref 22–32)

## 2023-10-06 LAB — DIFFERENTIAL
Abs Immature Granulocytes: 0.03 K/uL (ref 0.00–0.07)
Basophils Absolute: 0 K/uL (ref 0.0–0.1)
Basophils Relative: 1 %
Eosinophils Absolute: 0.1 K/uL (ref 0.0–0.5)
Eosinophils Relative: 2 %
Immature Granulocytes: 0 %
Lymphocytes Relative: 24 %
Lymphs Abs: 1.8 K/uL (ref 0.7–4.0)
Monocytes Absolute: 0.6 K/uL (ref 0.1–1.0)
Monocytes Relative: 8 %
Neutro Abs: 5 K/uL (ref 1.7–7.7)
Neutrophils Relative %: 65 %

## 2023-10-06 LAB — URINALYSIS, ROUTINE W REFLEX MICROSCOPIC
Bacteria, UA: NONE SEEN
Bilirubin Urine: NEGATIVE
Glucose, UA: NEGATIVE mg/dL
Hgb urine dipstick: NEGATIVE
Ketones, ur: NEGATIVE mg/dL
Leukocytes,Ua: NEGATIVE
Nitrite: NEGATIVE
Protein, ur: NEGATIVE mg/dL
Specific Gravity, Urine: 1.003 — ABNORMAL LOW (ref 1.005–1.030)
pH: 8 (ref 5.0–8.0)

## 2023-10-06 LAB — PROTIME-INR
INR: 1 (ref 0.8–1.2)
Prothrombin Time: 13.4 s (ref 11.4–15.2)

## 2023-10-06 LAB — COMPREHENSIVE METABOLIC PANEL WITH GFR
ALT: 17 U/L (ref 0–44)
AST: 23 U/L (ref 15–41)
Albumin: 4.1 g/dL (ref 3.5–5.0)
Alkaline Phosphatase: 61 U/L (ref 38–126)
Anion gap: 11 (ref 5–15)
BUN: 16 mg/dL (ref 8–23)
CO2: 21 mmol/L — ABNORMAL LOW (ref 22–32)
Calcium: 9.8 mg/dL (ref 8.9–10.3)
Chloride: 103 mmol/L (ref 98–111)
Creatinine, Ser: 0.8 mg/dL (ref 0.44–1.00)
GFR, Estimated: >60 mL/min (ref >60)
Glucose, Bld: 140 mg/dL — ABNORMAL HIGH (ref 70–99)
Potassium: 3.4 mmol/L — ABNORMAL LOW (ref 3.5–5.1)
Sodium: 135 mmol/L (ref 135–145)
Total Bilirubin: 1 mg/dL (ref 0.0–1.2)
Total Protein: 7.6 g/dL (ref 6.5–8.1)

## 2023-10-06 LAB — ETHANOL: Alcohol, Ethyl (B): <15 mg/dL (ref <15)

## 2023-10-06 LAB — RAPID URINE DRUG SCREEN, HOSP PERFORMED
Amphetamines: NOT DETECTED
Barbiturates: NOT DETECTED
Benzodiazepines: NOT DETECTED
Cocaine: NOT DETECTED
Opiates: POSITIVE — AB
Tetrahydrocannabinol: NOT DETECTED

## 2023-10-06 LAB — CBG MONITORING, ED: Glucose-Capillary: 146 mg/dL — ABNORMAL HIGH (ref 70–99)

## 2023-10-06 LAB — APTT: aPTT: 25 s (ref 24–36)

## 2023-10-06 MED ORDER — ACETAMINOPHEN 500 MG PO TABS
1000.0000 mg | ORAL_TABLET | Freq: Four times a day (QID) | ORAL | Status: DC | PRN
  Administered 2023-10-07: 1000 mg via ORAL
  Filled 2023-10-06: qty 2

## 2023-10-06 MED ORDER — LACTATED RINGERS IV BOLUS
1000.0000 mL | Freq: Once | INTRAVENOUS | Status: AC
  Administered 2023-10-06: 1000 mL via INTRAVENOUS

## 2023-10-06 MED ORDER — SODIUM CHLORIDE 0.9% FLUSH
3.0000 mL | Freq: Once | INTRAVENOUS | Status: AC
  Administered 2023-10-06: 3 mL via INTRAVENOUS

## 2023-10-06 MED ORDER — POTASSIUM CHLORIDE CRYS ER 20 MEQ PO TBCR
40.0000 meq | EXTENDED_RELEASE_TABLET | Freq: Once | ORAL | Status: AC
Start: 2023-10-06 — End: 2023-10-06
  Administered 2023-10-06: 40 meq via ORAL
  Filled 2023-10-06: qty 2

## 2023-10-06 MED ORDER — ONDANSETRON HCL 4 MG/2ML IJ SOLN
4.0000 mg | Freq: Four times a day (QID) | INTRAMUSCULAR | Status: DC | PRN
  Administered 2023-10-07: 4 mg via INTRAVENOUS
  Filled 2023-10-06: qty 2

## 2023-10-06 MED ORDER — MELATONIN 3 MG PO TABS: 6.0000 mg | ORAL_TABLET | Freq: Every evening | ORAL | Status: DC | PRN

## 2023-10-06 MED ORDER — SODIUM CHLORIDE 0.9% FLUSH
3.0000 mL | Freq: Two times a day (BID) | INTRAVENOUS | Status: DC
  Administered 2023-10-07 – 2023-10-08 (×3): 3 mL via INTRAVENOUS

## 2023-10-06 MED ORDER — POLYETHYLENE GLYCOL 3350 17 G PO PACK: 17.0000 g | PACK | Freq: Every day | ORAL | Status: DC | PRN

## 2023-10-06 MED ORDER — ALBUTEROL SULFATE (2.5 MG/3ML) 0.083% IN NEBU: 2.5000 mg | INHALATION_SOLUTION | RESPIRATORY_TRACT | Status: DC | PRN

## 2023-10-06 NOTE — Code Documentation (Signed)
 Stroke Response Nurse Documentation Code Documentation  Tammy Fischer is a 73 y.o. female arriving to [facility]  via [facility] on 10/06/2023 with past medical hx of cancer. On No antithrombotic. Code stroke was activated by EMS.   Patient from home where she was LKW at 1710 and woke up at the bottom of the stairs with an episode of urine incontinence. She remembers walking to the stairs, becoming nauseous and lightheaded. Positive loss of consciousness. She had difficulty getting up stairs to shower. She then called her son who called EMS.   Stroke team at the bedside on patient arrival. Labs drawn and patient cleared for CT by Dr. Francesca. Patient to CT with team. Complaining of headache. NIHSS 1, see documentation for details and code stroke times. Patient with left facial droop on exam. The following imaging was completed:  CT Head. Patient is not a candidate for IV Thrombolytic due to stroke not suspected. Patient is not a candidate for IR due to no LVO.   Care Plan: Q30 until out of the window then Q2 assessments.   Bedside handoff with ED RN.    Tammy Fischer Stroke Response RN

## 2023-10-06 NOTE — ED Triage Notes (Signed)
 Pt was bib [facility] after her spouse called because of syncopal episode.. Pt states she was having a shower and went down stairs. All she reports remembering after was waking up in her own urine  Bp was 140/82 74 HR 94%RA Pt denies chest pain LVO+ on arrival.

## 2023-10-06 NOTE — H&P (Signed)
 History and Physical    Tammy Fischer FMW:[id number] DOB: 01/21/1951 DOA: 10/06/2023  PCP: Vernon Velna SAUNDERS, MD   Patient coming from: Home   Chief Complaint:  Chief Complaint  Patient presents with   Code Stroke    HPI:  Tammy Fischer is a 73 y.o. 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   Per patient she had been well although around 1AM woke from sleep with nausea, dizziness. She relates these symptoms similar to acephalgic migraines she has had in the past. She took OTC medication containing codeine and symptoms had resolved. However later in the morning had another episode of nausea and dizziness. This passed. However in the afternoon was about to go up stairs when her symptoms recurred. Does not remember falling. Next thing she remembers is waking on the floor. She was listening to an audiobook and based on the time passed, thinks she was unconscious for about 15 minutes. When she awoke she was confused, had urinated on her self. Struggled to get upstairs, took a shower then got in bed and called her son.   Per EMS run sheet, patient had called son at 1600 reporting that she passed out and woke up at bottom of staircase. Noted to have speech difficulty, unable to name watch. Was unable to stand and ambulate. Son and patient also reports she was having some twitching type movements in both legs.   At present she feels much better, and son acknowledges that overall appears much better in terms of mental status, speech etc. She has no hx of prior seizure, no recent syncopal episodes. No other recent falls. No injuries from her fall today     Review of Systems:  ROS complete and negative except as marked above   No Known Allergies  Prior to Admission medications   Medication Sig Start Date  End Date Taking? Authorizing Provider  aspirin 81 MG tablet Take 81 mg by mouth daily.    [provider]  Calcium Carb-Cholecalciferol (CALCIUM + D3) 600-200 MG-UNIT TABS Take 1 tablet by mouth daily.    [provider]  cyclobenzaprine  (FLEXERIL ) 10 MG tablet Take 1 tablet (10 mg total) by mouth 3 (three) times daily as needed for muscle spasms. 11/26/11   Livesay, Delroy SQUIBB, MD  fish oil-omega-3 fatty acids 1000 MG capsule Take 1 g by mouth daily.    [provider]  lovastatin (MEVACOR) 20 MG tablet Take 20 mg by mouth Every evening. 10/10/11   [provider]  Nutritional Supplements (JUICE PLUS FIBRE PO) Take 1 tablet by mouth daily.    [provider]  vitamin E (VITAMIN E) 1000 UNIT capsule Take 1,000 Units by mouth daily.    [provider]    Past Medical History:  Diagnosis Date   Breast cancer (HCC)    Family history of breast cancer    Family history of lung cancer    Family history of pancreatic cancer    Personal history of breast cancer    Personal history of chemotherapy    Personal history of radiation therapy     Past Surgical History:  Procedure Laterality Date   BREAST BIOPSY     BREAST LUMPECTOMY Left 1999   REDUCTION MAMMAPLASTY     1999     reports  that she has never smoked. She has never used smokeless tobacco. No history on file for alcohol use and drug use.  Family History  Problem Relation Age of Onset   Lung cancer Mother        d. 71   Pancreatic cancer Father        d. 44   Heart Problems Maternal Grandmother    Heart Problems Maternal Grandfather    Heart Problems Paternal Grandmother    Heart Problems Paternal Grandfather    Breast cancer Other        paternal great aunt, dx. mid 63s     Physical Exam: Vitals:   10/06/23 1829 10/06/23 1834 10/06/23 1855 10/06/23 1900  BP:  (!) 142/73  (!) 125/96  Pulse:    72  Resp:    (!) 26  SpO2:    100%  Weight: 69.6 kg     Height:   5' 6  (1.676 m)     Gen: Awake, alert, NAD  HEENT: No tongue bite wound. Left pupil is teardrop shaped (reports chronic from prior surgery) although reactive  CV: Regular, normal S1, S2, no murmurs  Resp: Normal WOB, CTAB  Abd: Flat, normoactive, nontender MSK: Symmetric, no edema  Skin: No rashes or lesions to exposed skin  Neuro: Alert and interactive, fully oriented, speech is normal without aphasia, CN II-XII intact, motor is 5/5 and symmetric, sensation is intact and equal to fine touch  Psych: euthymic, appropriate    Data review:   Labs reviewed, notable for:   Bicarb 21, no AG K 3.4  Other chemistries and blood counts unremarkable   Micro:  Results for orders placed or performed in visit on 04/06/19  Novel Coronavirus, NAA ([facility])     Status: None   Collection Time: 04/06/19  2:46 PM   Specimen: Nasopharyngeal(NP) swabs in vial transport medium   NASOPHARYNGE  TESTING  Result Value Ref Range Status   SARS-CoV-2, NAA Not Detected Not Detected Final    Comment: This nucleic acid amplification test was developed and its performance characteristics determined by [employer]. Nucleic acid amplification tests include RT-PCR and TMA. This test has not been FDA cleared or approved. This test has been authorized by FDA under an Emergency Use Authorization (EUA). This test is only authorized for the duration of time the declaration that circumstances exist justifying the authorization of the emergency use of in vitro diagnostic tests for detection of SARS-CoV-2 virus and/or diagnosis of COVID-19 infection under section 564(b)(1) of the Act, 21 U.S.C. 639aaa-6(a) (1), unless the authorization is terminated or revoked sooner. When diagnostic testing is negative, the possibility of a false negative result should be considered in the context of a patient's recent exposures and the presence of clinical signs and symptoms consistent with COVID-19. An individual without  symptoms of COVID-19 and who is not shedding SARS-CoV-2 virus wo uld expect to have a negative (not detected) result in this assay.     Imaging reviewed:  CT HEAD CODE STROKE WO CONTRAST Result Date: 10/06/2023 CLINICAL DATA:  Code stroke. Provided history: Neuro deficit, acute, stroke suspected. Confusion. Aphasia. Syncopal episode. EXAM: CT HEAD WITHOUT CONTRAST TECHNIQUE: Contiguous axial images were obtained from the base of the skull through the vertex without intravenous contrast. RADIATION DOSE REDUCTION: This exam was performed according to the departmental dose-optimization program which includes automated exposure control, adjustment of the mA and/or kV according to patient size and/or use of iterative reconstruction technique. COMPARISON:  None. FINDINGS: Brain:  No age-advanced or lobar predominant cerebral atrophy. Patchy and ill-defined hypoattenuation within the cerebral white matter, nonspecific but compatible with mild chronic small vessel ischemic disease. There is no acute intracranial hemorrhage. No demarcated cortical infarct. No extra-axial fluid collection. No evidence of an intracranial mass. No midline shift. Vascular: No hyperdense vessel.  Atherosclerotic calcifications. Skull: No calvarial fracture or aggressive osseous lesion. Sinuses/Orbits: No mass or acute finding within the imaged orbits. No significant paranasal sinus disease. ASPECTS [facility] Stroke Program Early CT Score) - Ganglionic level infarction (caudate, lentiform nuclei, internal capsule, insula, M1-M3 cortex): 7 - Supraganglionic infarction (M4-M6 cortex): 3 Total score (0-10 with 10 being normal): 10 No evidence of an acute intracranial abnormality. These results were communicated to Dr. Merrianne at 6:48 pmon 7/24/2025by text page via the [facility] messaging system. IMPRESSION: 1. No evidence of an acute intracranial abnormality. 2. Mild chronic small vessel ischemic changes within the cerebral white matter.  Electronically Signed   By: Rockey Childs D.O.   On: 10/06/2023 18:49    EKG: Personally reviewed SR, LAD, no acute ischemic changes    ED Course:  Arrived as code stroke, was evalauted by Neurology. CT Head negative. Not candidate for TNK due to not suspected stroke. Planned for MRI brain, routine EEG    Assessment/Plan:  73 y.o. 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   ? Syncope v seizure  Aphasia, gait instability  LKWT ~1545. Developed Nausea / dizziness and awoke ~ 1600 on floor, incontinent of urine. Following episode with confusion, agnosia, aphasia, gait difficulty per EMS assessment. Arrived as code stroke, was evalauted by Neurology. CT Head negative. Not candidate for TNK due to not suspected stroke. At time of my eval she has normal neurologic exam. Labs unrevealing. Awaiting additional imaging. Etiology unclear, question seizure with post ictal state given presentation. Possible complex migraine felt less likely  -- Evaluated by neurology, recommended for MRI brain without contrast, routine EEG  -- Give 1 L IVF. 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  HLD: Continue home Lovastatin  Glaucoma: Continue home gtts.     Body mass index is 24.77 kg/m.    DVT prophylaxis:  SCDs Code Status:  Full Code Diet:  Diet Orders (From admission, onward)     Start     Ordered   10/06/23 1959  Diet Heart Room service appropriate? Yes; Fluid consistency: Thin  Diet effective now       Comments: If passes swallow ok for hh diet  Question Answer Comment   Room service appropriate? Yes   Fluid consistency: Thin      10/06/23 1959           Family Communication:  Yes discussed with son at bedside  Consults:  Neurology   Admission status:   Observation, Telemetry bed  Severity of Illness: The appropriate patient status for this patient is OBSERVATION. Observation status is judged to be reasonable and necessary in order to provide the required intensity of service to ensure the patient's safety. The patient's presenting symptoms, physical exam findings, and  initial radiographic and laboratory data in the context of their medical condition is felt to place them at decreased risk for further clinical deterioration. Furthermore, it is anticipated that the patient will be medically stable for discharge from the hospital within 2 midnights of admission.    Dorn Dawson, MD [facility]  How to contact the [facility] Attending or Consulting provider 7A - 7P or covering provider during after hours 7P -7A, for this patient.  Check the care team in [facility] and look for a) attending/consulting [facility] provider listed and b) the [facility] team listed Log into [email or web address] and use [facility] universal password to access. If you do not have the password, please contact the hospital operator. Locate the [facility] provider you are looking for under [facility] and page to a number that you can be directly reached. If you still have difficulty reaching the provider, please page the [facility] (Director on Call) for the Hospitalists listed on amion for assistance.  10/06/2023, 8:03 PM

## 2023-10-06 NOTE — ED Notes (Signed)
 Patient transported to MRI

## 2023-10-06 NOTE — ED Provider Notes (Signed)
 [place] EMERGENCY DEPARTMENT AT [facility] Provider Note  CSN: [id number] Arrival date & time: 10/06/23 8177  Chief Complaint(s) Code Stroke  HPI Tammy Fischer is a 73 y.o. female history of breast cancer, presenting to the emergency department with possible syncope/loss of consciousness.  Patient was at home, said she was trying to go up the steps when she got nauseous and lightheaded and then lost consciousness.  Was incontinent of urine and woke up on the floor.  Not sure if she hit her head or anything else.  Denies any other pain or injuries.  Reports that she felt speech was abnormal,.  Spouse apparently called paramedics, brought her to ER as code stroke as they were concerned about possible stroke given speech changes.  Patient was seen by neurology on arrival.  She denies any chest pain, back pain, neck pain, abdominal pain, pain in her extremities.   Past Medical History Past Medical History:  Diagnosis Date   Breast cancer (HCC)    Family history of breast cancer    Family history of lung cancer    Family history of pancreatic cancer    Personal history of breast cancer    Personal history of chemotherapy    Personal history of radiation therapy    Patient Active Problem List   Diagnosis Date Noted   Syncope 10/06/2023   Genetic testing 11/08/2017   Personal history of breast cancer    Family history of pancreatic cancer    Family history of breast cancer    Family history of lung cancer    Breast cancer (HCC) 11/26/2011   Home Medication(s) Prior to Admission medications   Medication Sig Start Date End Date Taking? Authorizing Provider  aspirin 81 MG tablet Take 81 mg by mouth daily.    [provider]  Calcium Carb-Cholecalciferol (CALCIUM + D3) 600-200 MG-UNIT TABS Take 1 tablet by mouth daily.    [provider]  cyclobenzaprine  (FLEXERIL ) 10 MG tablet Take 1 tablet (10 mg total) by mouth 3 (three) times daily as needed for muscle  spasms. 11/26/11   Livesay, Delroy SQUIBB, MD  fish oil-omega-3 fatty acids 1000 MG capsule Take 1 g by mouth daily.    [provider]  lovastatin (MEVACOR) 20 MG tablet Take 20 mg by mouth Every evening. 10/10/11   [provider]  Nutritional Supplements (JUICE PLUS FIBRE PO) Take 1 tablet by mouth daily.    [provider]  vitamin E (VITAMIN E) 1000 UNIT capsule Take 1,000 Units by mouth daily.    [provider]                                                                                                                                    Past Surgical History Past Surgical History:  Procedure Laterality Date   BREAST BIOPSY     BREAST LUMPECTOMY Left 1999   REDUCTION MAMMAPLASTY  1999   Family History Family History  Problem Relation Age of Onset   Lung cancer Mother        d. 12   Pancreatic cancer Father        d. 8   Heart Problems Maternal Grandmother    Heart Problems Maternal Grandfather    Heart Problems Paternal Grandmother    Heart Problems Paternal Grandfather    Breast cancer Other        paternal great aunt, dx. mid 19s    Social History Social History   Tobacco Use   Smoking status: Never   Smokeless tobacco: Never   Allergies Patient has no known allergies.  Review of Systems Review of Systems  All other systems reviewed and are negative.   Physical Exam Vital Signs  I have reviewed the triage vital signs BP (!) 125/96   Pulse 72   Resp (!) 26   Ht 5' 6 (1.676 m)   Wt 69.6 kg   SpO2 100%   BMI 24.77 kg/m  Physical Exam Vitals and nursing note reviewed.  Constitutional:      General: She is not in acute distress.    Appearance: She is well-developed.  HENT:     Head: Normocephalic and atraumatic.     Mouth/Throat:     Mouth: Mucous membranes are moist.  Eyes:     Pupils: Pupils are equal, round, and reactive to light.  Cardiovascular:     Rate and Rhythm: Normal rate and regular rhythm.      Heart sounds: No murmur heard. Pulmonary:     Effort: Pulmonary effort is normal. No respiratory distress.     Breath sounds: Normal breath sounds.  Abdominal:     General: Abdomen is flat.     Palpations: Abdomen is soft.     Tenderness: There is no abdominal tenderness.  Musculoskeletal:        General: No tenderness.     Right lower leg: No edema.     Left lower leg: No edema.  Skin:    General: Skin is warm and dry.  Neurological:     General: No focal deficit present.     Mental Status: She is alert. Mental status is at baseline.     Comments: Cranial nerves II through XII intact, strength 5 out of 5 in the bilateral upper and lower extremities, no sensory deficit to light touch, no dysmetria on finger-nose-finger testing  Psychiatric:        Mood and Affect: Mood normal.        Behavior: Behavior normal.     ED Results and Treatments Labs (all labs ordered are listed, but only abnormal results are displayed) Labs Reviewed  COMPREHENSIVE METABOLIC PANEL WITH GFR - Abnormal; Notable for the following components:      Result Value   Potassium 3.4 (*)    CO2 21 (*)    Glucose, Bld 140 (*)    All other components within normal limits  I-STAT CHEM 8, ED - Abnormal; Notable for the following components:   Glucose, Bld 138 (*)    TCO2 20 (*)    All other components within normal limits  CBG MONITORING, ED - Abnormal; Notable for the following components:   Glucose-Capillary 146 (*)    All other components within normal limits  PROTIME-INR  APTT  CBC  DIFFERENTIAL  ETHANOL  BASIC METABOLIC PANEL WITH GFR  CBC  MAGNESIUM  PHOSPHORUS  Radiology CT HEAD CODE STROKE WO CONTRAST Result Date: 10/06/2023 CLINICAL DATA:  Code stroke. Provided history: Neuro deficit, acute, stroke suspected. Confusion. Aphasia. Syncopal episode. EXAM: CT HEAD WITHOUT  CONTRAST TECHNIQUE: Contiguous axial images were obtained from the base of the skull through the vertex without intravenous contrast. RADIATION DOSE REDUCTION: This exam was performed according to the departmental dose-optimization program which includes automated exposure control, adjustment of the mA and/or kV according to patient size and/or use of iterative reconstruction technique. COMPARISON:  None. FINDINGS: Brain: No age-advanced or lobar predominant cerebral atrophy. Patchy and ill-defined hypoattenuation within the cerebral white matter, nonspecific but compatible with mild chronic small vessel ischemic disease. There is no acute intracranial hemorrhage. No demarcated cortical infarct. No extra-axial fluid collection. No evidence of an intracranial mass. No midline shift. Vascular: No hyperdense vessel.  Atherosclerotic calcifications. Skull: No calvarial fracture or aggressive osseous lesion. Sinuses/Orbits: No mass or acute finding within the imaged orbits. No significant paranasal sinus disease. ASPECTS [facility] Stroke Program Early CT Score) - Ganglionic level infarction (caudate, lentiform nuclei, internal capsule, insula, M1-M3 cortex): 7 - Supraganglionic infarction (M4-M6 cortex): 3 Total score (0-10 with 10 being normal): 10 No evidence of an acute intracranial abnormality. These results were communicated to Dr. Merrianne at 6:48 pmon 7/24/2025by text page via the [facility] messaging system. IMPRESSION: 1. No evidence of an acute intracranial abnormality. 2. Mild chronic small vessel ischemic changes within the cerebral white matter. Electronically Signed   By: Rockey Childs D.O.   On: 10/06/2023 18:49    Pertinent labs & imaging results that were available during my care of the patient were reviewed by me and considered in my medical decision making (see MDM for details).  Medications Ordered in ED Medications  sodium chloride  flush (NS) 0.9 % injection 3 mL (has no administration in time range)   lactated ringers  bolus 1,000 mL (has no administration in time range)  potassium chloride  SA (KLOR-CON  M) CR tablet 40 mEq (has no administration in time range)  acetaminophen  (TYLENOL ) tablet 1,000 mg (has no administration in time range)  albuterol  (PROVENTIL ) (2.5 MG/3ML) 0.083% nebulizer solution 2.5 mg (has no administration in time range)  melatonin tablet 6 mg (has no administration in time range)  ondansetron  (ZOFRAN ) injection 4 mg (has no administration in time range)  polyethylene glycol (MIRALAX  / GLYCOLAX ) packet 17 g (has no administration in time range)  sodium chloride  flush (NS) 0.9 % injection 3 mL (3 mLs Intravenous Given 10/06/23 1917)                                                                                                                                     Procedures .Critical Care  Performed by: Francesca Elsie CROME, MD Authorized by: Francesca Elsie CROME, MD   Critical care provider statement:    Critical care time (minutes):  30   Critical care was necessary to treat or prevent  imminent or life-threatening deterioration of the following conditions:  CNS failure or compromise   Critical care was time spent personally by me on the following activities:  Development of treatment plan with patient or surrogate, discussions with consultants, evaluation of patient's response to treatment, examination of patient, ordering and review of laboratory studies, ordering and review of radiographic studies, ordering and performing treatments and interventions, pulse oximetry, re-evaluation of patient's condition and review of old charts   Care discussed with: admitting provider     (including critical care time)  Medical Decision Making / ED Course   MDM:  73 year old presenting to the emergency department with episode of loss of consciousness/code stroke.  Patient overall well-appearing.  On my exam I was unable to appreciate any focal neurologic deficit.  Examination  otherwise reassuring.  Patient brought in as a code stroke out of concern for speech abnormality and was seen by neurology.  TNK was considered however symptoms were felt to be too mild/not fully consistent with stroke.  LVO criteria negative.  Neurology recommended admission for MRI and EEG, concern for possible seizure activity and confirm no sign of stroke.  Other testing reassuring.  Patient denies other symptoms like chest pain or shortness of breath to suggest any cardiopulmonary cause of her symptoms.  Can be admitted for observation and further workup.  Discussed with Dr. Keturah with hospitalist who is admitted patient.      Additional history obtained: -Additional history obtained from ems -External records from outside source obtained and reviewed including: Chart review including previous notes, labs, imaging, consultation notes including prior notes    Lab Tests: -I ordered, reviewed, and interpreted labs.   The pertinent results include:   Labs Reviewed  COMPREHENSIVE METABOLIC PANEL WITH GFR - Abnormal; Notable for the following components:      Result Value   Potassium 3.4 (*)    CO2 21 (*)    Glucose, Bld 140 (*)    All other components within normal limits  I-STAT CHEM 8, ED - Abnormal; Notable for the following components:   Glucose, Bld 138 (*)    TCO2 20 (*)    All other components within normal limits  CBG MONITORING, ED - Abnormal; Notable for the following components:   Glucose-Capillary 146 (*)    All other components within normal limits  PROTIME-INR  APTT  CBC  DIFFERENTIAL  ETHANOL  BASIC METABOLIC PANEL WITH GFR  CBC  MAGNESIUM  PHOSPHORUS    Notable for mild hyperglycemia   EKG   EKG Interpretation Date/Time:  Thursday October 06 2023 18:54:12 EDT Ventricular Rate:  71 PR Interval:  130 QRS Duration:  96 QT Interval:  437 QTC Calculation: 475 R Axis:   -18  Text Interpretation: Sinus rhythm Borderline left axis deviation Borderline  repolarization abnormality Confirmed by Francesca Fallow ([id number]) on 10/06/2023 6:58:24 PM         Imaging Studies ordered: I ordered imaging studies including CT head On my interpretation imaging demonstrates no acute process I independently visualized and interpreted imaging. I agree with the radiologist interpretation   Medicines ordered and prescription drug management: Meds ordered this encounter  Medications   sodium chloride  flush (NS) 0.9 % injection 3 mL   sodium chloride  flush (NS) 0.9 % injection 3 mL   lactated ringers  bolus 1,000 mL   potassium chloride  SA (KLOR-CON  M) CR tablet 40 mEq   acetaminophen  (TYLENOL ) tablet 1,000 mg   albuterol  (PROVENTIL ) (2.5 MG/3ML) 0.083% nebulizer solution 2.5 mg  melatonin tablet 6 mg   ondansetron  (ZOFRAN ) injection 4 mg   polyethylene glycol (MIRALAX  / GLYCOLAX ) packet 17 g    -I have reviewed the patients home medicines and have made adjustments as needed   Consultations Obtained: I requested consultation with the hospitalist,  and discussed lab and imaging findings as well as pertinent plan - they recommend: admission   Cardiac Monitoring: The patient was maintained on a cardiac monitor.  I personally viewed and interpreted the cardiac monitored which showed an underlying rhythm of: NSR  Reevaluation: After the interventions noted above, I reevaluated the patient and found that their symptoms have improved  Co morbidities that complicate the patient evaluation  Past Medical History:  Diagnosis Date   Breast cancer (HCC)    Family history of breast cancer    Family history of lung cancer    Family history of pancreatic cancer    Personal history of breast cancer    Personal history of chemotherapy    Personal history of radiation therapy       Dispostion: Disposition decision including need for hospitalization was considered, and patient admitted to the hospital.    Final Clinical Impression(s) / ED  Diagnoses Final diagnoses:  Syncope and collapse     This chart was dictated using voice recognition software.  Despite best efforts to proofread,  errors can occur which can change the documentation meaning.    Francesca Elsie CROME, MD 10/06/23 [place]

## 2023-10-06 NOTE — ED Notes (Signed)
 Patient ambulatory to RR. Linen changed and warm blankets provided.

## 2023-10-06 NOTE — Consult Note (Signed)
 NEUROLOGY CONSULT NOTE   Date of service: October 06, 2023 Patient Name: Tammy Fischer MRN:  [id number] DOB:  12/02/1950 Chief Complaint: Loss of consciousness with urinary incontinence Requesting Provider: Francesca Elsie CROME, MD  History of Present Illness  Tammy Fischer is a 73 y.o. female with a PMHx of breast cancer s/p left lumpectomy in 1999, chemo and radiation therapy and acephalgic migraines who presents from home to the ED via EMS as a Code Stroke after her spouse called because of syncopal episode. Patient states she was going upstairs at the time of symptom onset. She was trying to go up the steps when she got nauseous and lightheaded and then lost consciousness. All she reports remembering after was waking up in her own urine on the carpeted floor about 2 feet from the bottom of the stairs. Reports that she felt speech was abnormal. She then struggled back up the stairs, took a shower due to having been incontinent and son called EMS. She is unsure how long she was unconscious. She denies having any pain or other evidence of an injury, but may have fallen. She denies any prodromal symptoms. She does not feel that she had a seizure and has no history of such. She states that she is currently experiencing migraine symptoms without any headache pain and that this has happened to her before. BP with EMS was 140/82, 74 HR, 94% RA.  On ROS she denies any pain. She does not give a coherent response when asked what her migraine-like symptoms are, other than that she has no headache. She is tearful with labile mood at the bridge and before CT.   LKW: 1710 Modified rankin score: 0 IV Thrombolysis/EVT: No: No focal deficits concerning for stroke   NIHSS components Score: Comment  1a Level of Conscious 0[x]  1[]  2[]  3[]      1b LOC Questions 0[x]  1[]  2[]       1c LOC Commands 0[x]  1[]  2[]       2 Best Gaze 0[x]  1[]  2[]       3 Visual 0[x]  1[]  2[]  3[]      4 Facial Palsy 0[x]  1[]  2[]  3[]      5a  Motor Arm - left 0[x]  1[]  2[]  3[]  4[]  UN[]     5b Motor Arm - Right 0[x]  1[]  2[]  3[]  4[]  UN[]    6a Motor Leg - Left 0[x]  1[]  2[]  3[]  4[]  UN[]    6b Motor Leg - Right 0[x]  1[]  2[]  3[]  4[]  UN[]    7 Limb Ataxia 0[x]  1[]  2[]  UN[]      8 Sensory 0[x]  1[]  2[]  UN[]      9 Best Language 0[x]  1[]  2[]  3[]      10 Dysarthria 0[x]  1[]  2[]  UN[]      11 Extinct. and Inattention 0[x]  1[]  2[]       TOTAL:   0      ROS  ROS performed and pertinent positives documented in HPI. Unable to obtain a comprehensive ROS due to acuity of presentation.    Past History   Past Medical History:  Diagnosis Date   Breast cancer (HCC)    Family history of breast cancer    Family history of lung cancer    Family history of pancreatic cancer    Personal history of breast cancer    Personal history of chemotherapy    Personal history of radiation therapy     Past Surgical History:  Procedure Laterality Date   BREAST BIOPSY     BREAST LUMPECTOMY Left 1999   REDUCTION  MAMMAPLASTY     1999    Family History: Family History  Problem Relation Age of Onset   Lung cancer Mother        d. 63   Pancreatic cancer Father        d. 60   Heart Problems Maternal Grandmother    Heart Problems Maternal Grandfather    Heart Problems Paternal Grandmother    Heart Problems Paternal Grandfather    Breast cancer Other        paternal great aunt, dx. mid 15s    Social History  reports that she has never smoked. She has never used smokeless tobacco. No history on file for alcohol use and drug use.  No Known Allergies  Medications   Current Facility-Administered Medications:    sodium chloride  flush (NS) 0.9 % injection 3 mL, 3 mL, Intravenous, Once, Scheving, Elsie CROME, MD  Current Outpatient Medications:    aspirin 81 MG tablet, Take 81 mg by mouth daily., Disp: , Rfl:    Calcium Carb-Cholecalciferol (CALCIUM + D3) 600-200 MG-UNIT TABS, Take 1 tablet by mouth daily., Disp: , Rfl:    cyclobenzaprine  (FLEXERIL ) 10 MG  tablet, Take 1 tablet (10 mg total) by mouth 3 (three) times daily as needed for muscle spasms., Disp: 30 tablet, Rfl: 0   fish oil-omega-3 fatty acids 1000 MG capsule, Take 1 g by mouth daily., Disp: , Rfl:    lovastatin (MEVACOR) 20 MG tablet, Take 20 mg by mouth Every evening., Disp: , Rfl:    Nutritional Supplements (JUICE PLUS FIBRE PO), Take 1 tablet by mouth daily., Disp: , Rfl:    vitamin E (VITAMIN E) 1000 UNIT capsule, Take 1,000 Units by mouth daily., Disp: , Rfl:   Vitals   Vitals:   10/06/23 1829  Weight: 69.6 kg    There is no height or weight on file to calculate BMI.   Physical Exam   Constitutional: Appears well-developed and well-nourished.  Psych: Labile and tearful affect.  Eyes: No scleral injection.  HENT: No OP obstruction.  Head: Normocephalic, atraumatic Respiratory: Mildly tachypneic with no grossly audible wheezing.    Neurologic Examination   See NIHSS.   Labs/Imaging/Neurodiagnostic studies   CBC: No results for input(s): WBC, NEUTROABS, HGB, HCT, MCV, PLT in the last 168 hours. Basic Metabolic Panel:  Lab Results  Component Value Date   NA 139 11/26/2011   K 4.6 11/26/2011   CO2 25 11/26/2011   GLUCOSE 79 11/26/2011   BUN 14.0 11/26/2011   CREATININE 0.8 11/26/2011   CALCIUM 9.6 11/26/2011     ASSESSMENT  Tammy Fischer is a 73 y.o. female presenting after an episode of LOC with incontinence at home.  - Exam reveals a tearful and anxious female with labile mood. No focal neurological deficits are noted. She is mildly tachypneic with BP 142/73, HR 75 and is afebrile.  - CT head: No evidence of an acute intracranial abnormality. Mild chronic small vessel ischemic changes within the cerebral white matter. - UDS is opiate-positive. EtOH negative. Glucose 140. CBC normal. Na and ionized Ca normal. Mildly hypokalemic with potassium of 3.4. CO2 is low at 21 in the setting of mild tachypnea. LFTs normal. BUN and Cr normal.  - DDx for  her presentation includes new-onset seizure and syncope (atypical given her incontinence). Symptoms do not militate in favor of stroke, but CTA to assess for vertebrobasilar insufficiency will need to be obtained. Although she states that she is having symptoms that are consistent with  an acephalgic migraine, LOC with incontinence due to a migraine phenomenon would be a highly atypical presentation.    RECOMMENDATIONS  - MRI brain - CTA of head and neck - EEG  - Orthostatics - Frequent neuro checks and seizure precautions - IVF ______________________________________________________________________    Bonney SHARK, Luisdaniel Kenton, MD [facility]

## 2023-10-07 ENCOUNTER — Observation Stay (HOSPITAL_COMMUNITY)

## 2023-10-07 DIAGNOSIS — I6622 Occlusion and stenosis of left posterior cerebral artery: Secondary | ICD-10-CM | POA: Diagnosis not present

## 2023-10-07 DIAGNOSIS — R55 Syncope and collapse: Secondary | ICD-10-CM | POA: Diagnosis not present

## 2023-10-07 DIAGNOSIS — I700 Atherosclerosis of aorta: Secondary | ICD-10-CM | POA: Diagnosis not present

## 2023-10-07 DIAGNOSIS — I6523 Occlusion and stenosis of bilateral carotid arteries: Secondary | ICD-10-CM | POA: Diagnosis not present

## 2023-10-07 DIAGNOSIS — I6782 Cerebral ischemia: Secondary | ICD-10-CM | POA: Diagnosis not present

## 2023-10-07 LAB — BASIC METABOLIC PANEL WITH GFR
Anion gap: 8 (ref 5–15)
BUN: 12 mg/dL (ref 8–23)
CO2: 23 mmol/L (ref 22–32)
Calcium: 9.4 mg/dL (ref 8.9–10.3)
Chloride: 108 mmol/L (ref 98–111)
Creatinine, Ser: 0.7 mg/dL (ref 0.44–1.00)
GFR, Estimated: >60 mL/min (ref >60)
Glucose, Bld: 106 mg/dL — ABNORMAL HIGH (ref 70–99)
Potassium: 3.7 mmol/L (ref 3.5–5.1)
Sodium: 139 mmol/L (ref 135–145)

## 2023-10-07 LAB — CBC
HCT: 37.6 % (ref 36.0–46.0)
Hemoglobin: 12.8 g/dL (ref 12.0–15.0)
MCH: 32.7 pg (ref 26.0–34.0)
MCHC: 34 g/dL (ref 30.0–36.0)
MCV: 95.9 fL (ref 80.0–100.0)
Platelets: 169 K/uL (ref 150–400)
RBC: 3.92 MIL/uL (ref 3.87–5.11)
RDW: 13.3 % (ref 11.5–15.5)
WBC: 6.2 K/uL (ref 4.0–10.5)
nRBC: 0 % (ref 0.0–0.2)

## 2023-10-07 LAB — PHOSPHORUS: Phosphorus: 3.9 mg/dL (ref 2.5–4.6)

## 2023-10-07 LAB — MAGNESIUM: Magnesium: 2 mg/dL (ref 1.7–2.4)

## 2023-10-07 MED ORDER — DORZOLAMIDE HCL-TIMOLOL MAL 2-0.5 % OP SOLN
1.0000 [drp] | Freq: Two times a day (BID) | OPHTHALMIC | Status: DC
  Administered 2023-10-07 – 2023-10-08 (×2): 1 [drp] via OPHTHALMIC
  Filled 2023-10-07: qty 10

## 2023-10-07 MED ORDER — KETOROLAC TROMETHAMINE 15 MG/ML IJ SOLN
15.0000 mg | Freq: Three times a day (TID) | INTRAMUSCULAR | Status: DC | PRN
  Filled 2023-10-07: qty 1

## 2023-10-07 MED ORDER — IOHEXOL 350 MG/ML SOLN
75.0000 mL | Freq: Once | INTRAVENOUS | Status: AC | PRN
  Administered 2023-10-07: 75 mL via INTRAVENOUS

## 2023-10-07 MED ORDER — LATANOPROST 0.005 % OP SOLN
1.0000 [drp] | Freq: Every day | OPHTHALMIC | Status: DC
  Administered 2023-10-07: 1 [drp] via OPHTHALMIC
  Filled 2023-10-07: qty 2.5

## 2023-10-07 MED ORDER — PROCHLORPERAZINE EDISYLATE 10 MG/2ML IJ SOLN
5.0000 mg | Freq: Once | INTRAMUSCULAR | Status: DC
  Filled 2023-10-07: qty 2

## 2023-10-07 NOTE — TOC CM/SW Note (Signed)
 Transition of Care [facility]) - Inpatient Brief Assessment   Patient Details  Name: Chairty Toman MRN: [id number] Date of Birth: 1950-11-18  [facility] of [facility] CM/SW Contact:    Almarie CHRISTELLA Goodie, LCSW Phone Number: 10/07/2023, 4:17 PM   Clinical Narrative:   Patient from home, therapy has no follow up recommendations. No TOC needs identified at this time.    Transition of Care Asessment: Insurance and Status: Insurance coverage has been reviewed Patient has primary care physician: Yes Home environment has been reviewed: Home Prior level of function:: Independent Prior/Current Home Services: No current home services Social Drivers of Health Review: SDOH reviewed no interventions necessary Readmission risk has been reviewed: Yes Transition of care needs: no transition of care needs at this time

## 2023-10-07 NOTE — Plan of Care (Signed)

## 2023-10-07 NOTE — Procedures (Signed)
 Patient Name: Tammy Fischer  MRN: [id number]  Epilepsy Attending: Arlin MALVA Krebs  Referring Physician/Provider: Keturah Carrier, MD  Date: 10/07/2023 Duration: 24.33 mins  Patient history: 73 y.o. female presenting after an episode of LOC with incontinence at home.   Level of alertness: Awake, asleep  AEDs during EEG study: None  Technical aspects: This EEG study was done with scalp electrodes positioned according to the 10-20 International system of electrode placement. Electrical activity was reviewed with band pass filter of 1-70Hz , sensitivity of 7 uV/mm, display speed of 33mm/sec with a 60Hz  notched filter applied as appropriate. EEG data were recorded continuously and digitally stored.  Video monitoring was available and reviewed as appropriate.  Description: The posterior dominant rhythm consists of 9 Hz activity of moderate voltage (25-35 uV) seen predominantly in posterior head regions, symmetric and reactive to eye opening and eye closing. Sleep was characterized by vertex waves, sleep spindles (12 to 14 Hz), maximal frontocentral region.  Hyperventilation and photic stimulation were not performed.     IMPRESSION: This study is within normal limits. No seizures or epileptiform discharges were seen throughout the recording.  A normal interictal EEG does not exclude the diagnosis of epilepsy.   Amybeth Sieg O Earnest Thalman

## 2023-10-07 NOTE — Evaluation (Addendum)
 Physical Therapy Evaluation & Discharge Patient Details Name: Tammy Fischer MRN: [id number] DOB: 29-Jul-1950 Today's Date: 10/07/2023  History of Present Illness  Pt is a 73 y/o F admitted on 10/06/23 after presenting with possible syncopal episode vs seizure after pt awoke on floor, unable to recall events of what led to this. 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 Pt reports prior to admission she was independent without AD, denies falls, driving & very active in the community. On this date,  pt is mod I<>independent with all mobility. Pt reports she feels like she typically does after a migraine. At this time, pt does not require acute PT services. PT to complete current orders, please re-consult if new needs arise.  BP checked in RUE: Semi fowler in bed: 138/84 mmHg MAP 102 Sitting EOB: 139/70 mmHg MAP 91 Standing at 0: 118/88 mmHg MAP 97 Standing after gait: 125/88 mmHg MAP 99 No c/o symptoms throughout session        If plan is discharge home, recommend the following:     Can travel by private vehicle        Equipment Recommendations None recommended by PT  Recommendations for Other Services       Functional Status Assessment Patient has not had a recent decline in their functional status     Precautions / Restrictions Precautions Precautions: None Restrictions Weight Bearing Restrictions Per Provider Order: No      Mobility  Bed Mobility Overal bed mobility: Independent             General bed mobility comments: supine<>sit from ED stretcher    Transfers Overall transfer level: Independent Equipment used: None                    Ambulation/Gait Ambulation/Gait assistance: Modified independent (Device/Increase time) Gait Distance (Feet): 110 Feet Assistive device: None Gait  Pattern/deviations: Step-through pattern, Decreased stride length, Decreased step length - left, Decreased step length - right Gait velocity: decreased     General Gait Details: decreased BUE reciprocal arm swing  Stairs            Wheelchair Mobility     Tilt Bed    Modified Rankin (Stroke Patients Only)       Balance Overall balance assessment: Needs assistance Sitting-balance support: Feet supported Sitting balance-Leahy Scale: Normal     Standing balance support: During functional activity, No upper extremity supported Standing balance-Leahy Scale: Good                               Pertinent Vitals/Pain Pain Assessment Pain Assessment: Faces Faces Pain Scale: Hurts little more Pain Location: HA Pain Descriptors / Indicators: Headache Pain Intervention(s): Monitored during session    Home Living Family/patient expects to be discharged to:: Private residence Living Arrangements: Alone Available Help at Discharge: Family;Available PRN/intermittently Type of Home: House Home Access: Stairs to enter Entrance Stairs-Rails: Left Entrance Stairs-Number of Steps: 3 Alternate Level Stairs-Number of Steps: flight to bedroom Home Layout: Two level;Multi-level;Laundry or work area in basement        Prior Function Prior Level of Function : Driving;Independent/Modified Independent             Mobility Comments: Active in the community, driving, gardening, enjoys the opera. Independent without AD, denies  falls. ADLs Comments: Independent     Extremity/Trunk Assessment   Upper Extremity Assessment Upper Extremity Assessment: Overall WFL for tasks assessed    Lower Extremity Assessment Lower Extremity Assessment: Overall WFL for tasks assessed       Communication   Communication Communication: No apparent difficulties    Cognition Arousal: Alert Behavior During Therapy: WFL for tasks assessed/performed   PT - Cognitive impairments:  No apparent impairments                         Following commands: Intact       Cueing       General Comments      Exercises     Assessment/Plan    PT Assessment Patient does not need any further PT services  PT Problem List         PT Treatment Interventions      PT Goals (Current goals can be found in the Care Plan section)  Acute Rehab PT Goals Patient Stated Goal: feel better PT Goal Formulation: All assessment and education complete, DC therapy Time For Goal Achievement: 10/21/23 Potential to Achieve Goals: Good    Frequency       Co-evaluation               AM-PAC PT 6 Clicks Mobility  Outcome Measure Help needed turning from your back to your side while in a flat bed without using bedrails?: None Help needed moving from lying on your back to sitting on the side of a flat bed without using bedrails?: None Help needed moving to and from a bed to a chair (including a wheelchair)?: None Help needed standing up from a chair using your arms (e.g., wheelchair or bedside chair)?: None Help needed to walk in hospital room?: None Help needed climbing 3-5 steps with a railing? : None 6 Click Score: 24    End of Session   Activity Tolerance: Patient tolerated treatment well Patient left: in bed;with call bell/phone within reach;with family/visitor present Nurse Communication: Mobility status      Time: 9076-9060 PT Time Calculation (min) (ACUTE ONLY): 16 min   Charges:   PT Evaluation $PT Eval Low Complexity: 1 Low   PT General Charges $$ ACUTE PT VISIT: 1 Visit         Richerd Pinal, PT, DPT 10/07/23, 10:35 AM   Richerd CHRISTELLA Pinal 10/07/2023, 10:33 AM

## 2023-10-07 NOTE — Plan of Care (Signed)
 MRI brain:  No evidence of acute intracranial abnormality.   CTA neck:  1. The common carotid, internal carotid and vertebral arteries are patent within the neck without stenosis. Non-stenotic calcified plaque at the left common carotid artery origin. 2. Aortic Atherosclerosis   CTA head:  1. No proximal intracranial large vessel occlusion or high-grade proximal arterial stenosis identified. 2. Atherosclerotic plaque within the intracranial internal carotid arteries with no more than mild stenosis.  EEG: This study is within normal limits. No seizures or epileptiform discharges were seen throughout the recording.   Assessment: 73 y.o. female presenting after an episode of LOC with incontinence at home. UDS was opiate positive and exam revealed no focal neurological deficits in the context of anxious, tearful and labile affect. CO2 was low at 21 in the setting of mild tachypnea.   - EEG does not reveal any findings to suggest a seizure disorder. Of note, a negative EEG does not completely rule out seizures and she will need outpatient follow up with Neurology for this.  - Symptoms and exam findings do not militate in favor of stroke or TIA. CTA and MRI brain were unremarkable except for mild atherosclerotic plaque within the intracranial ICAs.  - Although she stated that she was having symptoms that are consistent with an acephalgic migraine, LOC with incontinence due to a migraine phenomenon would be a highly atypical presentation.  - Most likely etiology for her presentation is felt to have been syncope, but seizure is also on the DDx. Would avoid driving until seizure free for 6 months, or until cleared by outpatient Neurology, in addition to other outpatient precautions.   Recommendations: - Outpatient Neurology follow up for her acephalgic migraines and for repeat EEG - Start ASA 81 mg po every day - Statin - Stay well-hydrated  Electronically signed: Dr. Krishika Bugge

## 2023-10-07 NOTE — Progress Notes (Signed)
 OT Cancellation Note  Patient Details Name: Tammy Fischer MRN: [id number] DOB: 26-Oct-1950   Cancelled Treatment:    Reason Eval/Treat Not Completed: OT screened, no needs identified, will sign off.  Per discussion with PT pt is back to her functional baseline at modified independent/independent.  No further needs, will defer eval.    Manjinder Breau OTR/L 10/07/2023, 1:45 PM

## 2023-10-07 NOTE — Progress Notes (Signed)
 PROGRESS NOTE  Tammy Fischer  FMW:[id number] DOB: 1950-03-23 DOA: 10/06/2023 PCP: Vernon Velna SAUNDERS, MD  Consultants  Brief Narrative: 73 y.o. female with a PMH significant for hx of acephalgic migraines / hx of rare complex migaine in remote past, remote hx of left breast CA s/p lumpectomy, XRT, chemotherapy, HLD, who was brought in by EMS as code stroke after possible syncopal episode v seiziure.    Per patient she had been well although around 1AM woke from sleep with nausea, dizziness. She relates these symptoms similar to acephalgic migraines she has had in the past. She took OTC medication containing codeine and symptoms had resolved. However later in the morning had another episode of nausea and dizziness. This passed. However in the afternoon was about to go up stairs when her symptoms recurred. She was trying to go up the steps when she got nauseous and lightheaded and then lost consciousness. All she reports remembering after was waking up in her own urine on the carpeted floor about 2 feet from the bottom of the stairs. Reports that she felt speech was abnormal. She then struggled back up the stairs, took a shower due to having been incontinent and son called EMS. She is unsure how long she was unconscious. She denies having any pain or other evidence of an injury, but may have fallen. She denies any prodromal symptoms. She does not feel that she had a seizure and has no history of such. She states that she is currently experiencing migraine symptoms without any headache pain and that this has happened to her before. BP with EMS was 140/82, 74 HR, 94% RA.    Assessment & Plan: ? Syncope v seizure  Aphasia, gait instability  - History most consistent with seizure.  EEG was negative here.  She had MRI of her brain which was also negative for any intracranial process.  CTA neck head negative. -Awaiting neurology input now that tests are resulted. -Continue on telemetry -Much less likely that  this is complex/acephalgic migraine in light of urinary incontinence   Chronic medical problems:  acephalgic migraines / hx of rare complex migaine in remote past: Noted, she takes OTC medication Remote hx of left breast CA s/p lumpectomy, XRT, chemotherapy: reports she is in remission.  HLD: Continue home Lovastatin  Glaucoma: Continue home gtts.     DVT prophylaxis:  SCDs Start: 10/06/23 1958  Code Status:   Code Status: Full Code Family Communication: Both sons present at bedside and all questions answered Level of care: Telemetry Medical Status is: Observation Dispo: Observe overnight and await neurology input.  If continues to be seizure-free plan will be to discharge home tomorrow 7/26  Consults called: Neurology  Subjective: Patient lying in bed.  Complaining of neck pain from poor pillow.  Otherwise without concerns or complaints.  No further seizure-like activity since being brought to the hospital.  Objective: Vitals:   10/07/23 1315 10/07/23 1329 10/07/23 1346 10/07/23 1632  BP:  (!) 142/82 (!) 143/86 128/79  Pulse: 75 71 72 75  Resp: 18 17 14 16   Temp:   97.7 F (36.5 C) 97.9 F (36.6 C)  TempSrc:   Oral Oral  SpO2: 97% 100% 98% 97%  Weight:      Height:        Intake/Output Summary (Last 24 hours) at 10/07/2023 1641 Last data filed at 10/06/2023 2252 Gross per 24 hour  Intake 1000 ml  Output --  Net 1000 ml   [employer]  10/06/23 1829  Weight: 69.6 kg   Body mass index is 24.77 kg/m.  Gen: 73 y.o. female in no apparent distress.  Nontoxic Pulm: Non-labored breathing.  Clear to auscultation bilaterally.  CV: Regular rate and rhythm. No murmur, rub, or gallop. No JVD GI: Abdomen soft, non-tender, non-distended, with normoactive bowel sounds. No organomegaly or masses felt. Ext: Warm, no deformities, no pedal edema Skin: No rashes, lesions no ulcers Neuro: Alert and oriented. No focal neurological deficits. Psych: Calm  Judgement and insight  appear normal. Mood & affect appropriate.     I have personally reviewed the following labs and images: CBC: Recent Labs  Lab 10/06/23 1825 10/06/23 1830 10/07/23 0350  WBC 7.6  --  6.2  NEUTROABS 5.0  --   --   HGB 14.2 15.0 12.8  HCT 40.8 44.0 37.6  MCV 95.1  --  95.9  PLT 195  --  169   BMP &GFR Recent Labs  Lab 10/06/23 1825 10/06/23 1830 10/07/23 0350  NA 135 138 139  K 3.4* 3.6 3.7  CL 103 105 108  CO2 21*  --  23  GLUCOSE 140* 138* 106*  BUN 16 18 12   CREATININE 0.80 0.80 0.70  CALCIUM 9.8  --  9.4  MG  --   --  2.0  PHOS  --   --  3.9   Estimated Creatinine Clearance: 57.8 mL/min (by C-G formula based on SCr of 0.7 mg/dL). Liver & Pancreas: Recent Labs  Lab 10/06/23 1825  AST 23  ALT 17  ALKPHOS 61  BILITOT 1.0  PROT 7.6  ALBUMIN 4.1   No results for input(s): LIPASE, AMYLASE in the last 168 hours. No results for input(s): AMMONIA in the last 168 hours. Diabetic: No results for input(s): HGBA1C in the last 72 hours. Recent Labs  Lab 10/06/23 1825  GLUCAP 146*   Cardiac Enzymes: No results for input(s): CKTOTAL, CKMB, CKMBINDEX, TROPONINI in the last 168 hours. No results for input(s): PROBNP in the last 8760 hours. Coagulation Profile: Recent Labs  Lab 10/06/23 1825  INR 1.0   Thyroid  Function Tests: No results for input(s): TSH, T4TOTAL, FREET4, T3FREE, THYROIDAB in the last 72 hours. Lipid Profile: No results for input(s): CHOL, HDL, LDLCALC, TRIG, CHOLHDL, LDLDIRECT in the last 72 hours. Anemia Panel: No results for input(s): VITAMINB12, FOLATE, FERRITIN, TIBC, IRON, RETICCTPCT in the last 72 hours. Urine analysis:    Component Value Date/Time   COLORURINE STRAW (A) 10/06/2023 2008   APPEARANCEUR CLEAR 10/06/2023 2008   LABSPEC 1.003 (L) 10/06/2023 2008   PHURINE 8.0 10/06/2023 2008   GLUCOSEU NEGATIVE 10/06/2023 2008   HGBUR NEGATIVE 10/06/2023 2008   BILIRUBINUR NEGATIVE  10/06/2023 2008   KETONESUR NEGATIVE 10/06/2023 2008   PROTEINUR NEGATIVE 10/06/2023 2008   NITRITE NEGATIVE 10/06/2023 2008   LEUKOCYTESUR NEGATIVE 10/06/2023 2008   Sepsis Labs: Invalid input(s): PROCALCITONIN, LACTICIDVEN  Microbiology: No results found for this or any previous visit (from the past 240 hours).  Radiology Studies: CT ANGIO HEAD NECK W WO CM Result Date: 10/07/2023 CLINICAL DATA:  Provided history: Stroke, follow-up. Additional history provided: Speech difficulty yesterday. EXAM: CT ANGIOGRAPHY HEAD AND NECK WITH AND WITHOUT CONTRAST TECHNIQUE: Multidetector CT imaging of the head and neck was performed using the standard protocol during bolus administration of intravenous contrast. Multiplanar CT image reconstructions and MIPs were obtained to evaluate the vascular anatomy. Carotid stenosis measurements (when applicable) are obtained utilizing NASCET criteria, using the distal internal carotid diameter as the  denominator. RADIATION DOSE REDUCTION: This exam was performed according to the departmental dose-optimization program which includes automated exposure control, adjustment of the mA and/or kV according to patient size and/or use of iterative reconstruction technique. CONTRAST:  75mL OMNIPAQUE IOHEXOL 350 MG/ML SOLN COMPARISON:  Brain MRI 10/06/2023.  Head CT 10/06/2023. FINDINGS: CT HEAD FINDINGS Brain: No age-advanced or lobar predominant cerebral atrophy. Patchy and ill-defined hypoattenuation within the cerebral white matter, nonspecific but compatible with minimal chronic small vessel ischemic disease. There is no acute intracranial hemorrhage. No demarcated cortical infarct. No extra-axial fluid collection. No evidence of an intracranial mass. No midline shift. Vascular: No hyperdense vessel.  Atherosclerotic calcifications. Skull: No calvarial fracture or aggressive osseous lesion. Sinuses/Orbits: No orbital mass or acute orbital finding. No significant paranasal  sinus disease. Review of the MIP images confirms the above findings CTA NECK FINDINGS Aortic arch: Standard aortic branching. Atherosclerotic plaque within the aortic arch and proximal major branch vessels of the neck. No hemodynamically significant innominate or proximal subclavian artery stenosis. Right carotid system: CCA and ICA patent within the neck without stenosis or significant atherosclerotic disease. Left carotid system: CCA and ICA patent within the neck without stenosis. Nonstenotic atherosclerotic plaque at the CCA origin. Vertebral arteries: Patent within the neck without stenosis or significant atherosclerotic disease. The left vertebral artery is dominant. Skeleton: C4-C5 grade 1 anterolisthesis. Cervical spondylosis. Disc space narrowing is advanced at C5-C6 and C6-C7. Facet ankylosis on the left at C2-C3. Other neck: No neck mass or cervical lymphadenopathy. Upper chest: No consolidation within the imaged lung apices. Review of the MIP images confirms the above findings CTA HEAD FINDINGS Anterior circulation: The intracranial internal carotid arteries are patent. The M1 middle cerebral arteries are patent. No M2 proximal branch occlusion or high-grade proximal stenosis. The anterior cerebral arteries are patent. Dominant left anterior cerebral artery. Posterior circulation: The intracranial vertebral arteries are patent. The basilar artery is patent. The posterior cerebral arteries are patent. Mild-to-moderate stenosis within the left posterior cerebral artery at the P1/P2 junction. Posterior communicating arteries are diminutive or absent, bilaterally. Venous sinuses: Within the limitations of contrast timing, no convincing thrombus. Anatomic variants: As described. Review of the MIP images confirms the above findings IMPRESSION: CT head: 1.  No evidence of an acute intracranial abnormality. 2. Minor chronic small vessel ischemic changes within the cerebral white matter. CTA neck: 1. The common  carotid, internal carotid and vertebral arteries are patent within the neck without stenosis. Non-stenotic calcified plaque at the left common carotid artery origin. 2. Aortic Atherosclerosis (ICD10-I70.0). CTA head: 1. No proximal intracranial large vessel occlusion or high-grade proximal arterial stenosis identified. 2. Atherosclerotic plaque within the intracranial internal carotid arteries with no more than mild stenosis. Electronically Signed   By: Rockey Childs D.O.   On: 10/07/2023 08:20   EEG adult Result Date: 10/07/2023 Shelton Arlin KIDD, MD     10/07/2023  7:03 AM Patient Name: Tammy Fischer MRN: [id number] Epilepsy Attending: Arlin KIDD Shelton Referring Physician/Provider: Keturah Carrier, MD Date: 10/07/2023 Duration: 24.33 mins Patient history: 73 y.o. female presenting after an episode of LOC with incontinence at home. Level of alertness: Awake, asleep AEDs during EEG study: None Technical aspects: This EEG study was done with scalp electrodes positioned according to the 10-20 International system of electrode placement. Electrical activity was reviewed with band pass filter of 1-70Hz , sensitivity of 7 uV/mm, display speed of 78mm/sec with a 60Hz  notched filter applied as appropriate. EEG data were recorded continuously and digitally stored.  Video monitoring was available and reviewed as appropriate. Description: The posterior dominant rhythm consists of 9 Hz activity of moderate voltage (25-35 uV) seen predominantly in posterior head regions, symmetric and reactive to eye opening and eye closing. Sleep was characterized by vertex waves, sleep spindles (12 to 14 Hz), maximal frontocentral region.  Hyperventilation and photic stimulation were not performed.   IMPRESSION: This study is within normal limits. No seizures or epileptiform discharges were seen throughout the recording. A normal interictal EEG does not exclude the diagnosis of epilepsy. Arlin MALVA Krebs   MR BRAIN WO CONTRAST Result Date:  10/06/2023 CLINICAL DATA:  Neuro deficit, acute, stroke suspected EXAM: MRI HEAD WITHOUT CONTRAST TECHNIQUE: Multiplanar, multiecho pulse sequences of the brain and surrounding structures were obtained without intravenous contrast. COMPARISON:  Same day CT head. FINDINGS: Brain: No acute infarction, hemorrhage, hydrocephalus, extra-axial collection or mass lesion. Vascular: Normal flow voids. Skull and upper cervical spine: Normal marrow signal. Sinuses/Orbits: Negative. Other: None. IMPRESSION: No evidence of acute intracranial abnormality. Electronically Signed   By: Gilmore GORMAN Molt M.D.   On: 10/06/2023 22:11   CT HEAD CODE STROKE WO CONTRAST Result Date: 10/06/2023 CLINICAL DATA:  Code stroke. Provided history: Neuro deficit, acute, stroke suspected. Confusion. Aphasia. Syncopal episode. EXAM: CT HEAD WITHOUT CONTRAST TECHNIQUE: Contiguous axial images were obtained from the base of the skull through the vertex without intravenous contrast. RADIATION DOSE REDUCTION: This exam was performed according to the departmental dose-optimization program which includes automated exposure control, adjustment of the mA and/or kV according to patient size and/or use of iterative reconstruction technique. COMPARISON:  None. FINDINGS: Brain: No age-advanced or lobar predominant cerebral atrophy. Patchy and ill-defined hypoattenuation within the cerebral white matter, nonspecific but compatible with mild chronic small vessel ischemic disease. There is no acute intracranial hemorrhage. No demarcated cortical infarct. No extra-axial fluid collection. No evidence of an intracranial mass. No midline shift. Vascular: No hyperdense vessel.  Atherosclerotic calcifications. Skull: No calvarial fracture or aggressive osseous lesion. Sinuses/Orbits: No mass or acute finding within the imaged orbits. No significant paranasal sinus disease. ASPECTS [facility] Stroke Program Early CT Score) - Ganglionic level infarction (caudate,  lentiform nuclei, internal capsule, insula, M1-M3 cortex): 7 - Supraganglionic infarction (M4-M6 cortex): 3 Total score (0-10 with 10 being normal): 10 No evidence of an acute intracranial abnormality. These results were communicated to Dr. Merrianne at 6:48 pmon 7/24/2025by text page via the [facility] messaging system. IMPRESSION: 1. No evidence of an acute intracranial abnormality. 2. Mild chronic small vessel ischemic changes within the cerebral white matter. Electronically Signed   By: Rockey Childs D.O.   On: 10/06/2023 18:49    Scheduled Meds:  sodium chloride  flush  3 mL Intravenous Q12H   Continuous Infusions:   LOS: 0 days   35 minutes with more than 50% spent in reviewing records, counseling patient/family and coordinating care.  Reyes VEAR Gaw, MD [facility] [email or web address] 10/07/2023, 4:41 PM

## 2023-10-07 NOTE — ED Notes (Signed)
 Patient was given a cop of Hot tea.

## 2023-10-07 NOTE — Progress Notes (Signed)
 EEG complete - results pending

## 2023-10-07 NOTE — Care Management Obs Status (Signed)
 MEDICARE OBSERVATION STATUS NOTIFICATION   Patient Details  Name: Tammy Fischer MRN: [id number] Date of Birth: September 25, 1950   Medicare Observation Status Notification Given:  Yes    Almarie CHRISTELLA Goodie, LCSW 10/07/2023, 3:34 PM

## 2023-10-08 DIAGNOSIS — R55 Syncope and collapse: Secondary | ICD-10-CM | POA: Diagnosis not present

## 2023-10-08 NOTE — Plan of Care (Signed)
  Problem: Education: Goal: Knowledge of General Education information will improve Description: Including pain rating scale, medication(s)/side effects and non-pharmacologic comfort measures Outcome: Progressing   Problem: Health Behavior/Discharge Planning: Goal: Ability to manage health-related needs will improve Outcome: Progressing   Problem: Clinical Measurements: Goal: Will remain free from infection Outcome: Progressing   Problem: Nutrition: Goal: Adequate nutrition will be maintained Outcome: Progressing   Problem: Safety: Goal: Ability to remain free from injury will improve Outcome: Progressing   Problem: Pain Managment: Goal: General experience of comfort will improve and/or be controlled Outcome: Progressing   Problem: Skin Integrity: Goal: Risk for impaired skin integrity will decrease Outcome: Progressing

## 2023-10-08 NOTE — Progress Notes (Signed)
 Tammy Fischer to be discharged home per MD order. Discussed with the patient and all questions fully answered.  Skin clean, dry, and intact without evidence of skin break down. IV catheter discontinued intact. Site without signs and symptoms of complications. Dressing and pressure applied.  An After Visit Summary was printed and given to the patient.  Patient escorted via [facility], and discharged home via private auto.  Tammy Fischer  10/08/2023

## 2023-10-08 NOTE — Discharge Summary (Signed)
 Physician Discharge Summary   Patient: Tammy Fischer MRN: [id number] DOB: 1951/02/19  Admit date:     10/06/2023  Discharge date: 10/08/23  Discharge Physician: Reyes VEAR Gaw   PCP: Vernon Velna SAUNDERS, MD   Recommendations at discharge:  {Tip this will not be part of the note when signed- Example include specific recommendations for outpatient follow-up, pending tests to follow-up on. (Optional):26781}  - Outpatient Neurology follow up for her acephalgic migraines and for repeat EEG - Start aspirin 81 mg daily - Continue your statin - Stay well-hydrated  Discharge Diagnoses: Principal Problem:   Syncope  Resolved Problems:   * No resolved hospital problems. [facility] Course: No notes on file  Assessment and Plan: No notes have been filed under this hospital service. Service: Hospitalist     {Tip this will not be part of the note when signed Body mass index is 24.77 kg/m. , ,  (Optional):26781}  {(NOTE) Pain control PDMP Statment (Optional):26782} Consultants: *** Procedures performed: ***  Disposition: {Plan; Disposition:26390} Diet recommendation:  Discharge Diet Orders (From admission, onward)     Start     Ordered   10/08/23 0000  Diet - low sodium heart healthy        10/08/23 1024           {Diet_Plan:26776} DISCHARGE MEDICATION: Allergies as of 10/08/2023       Reactions   Atorvastatin Other (See Comments)   Other Reaction(s): difficulty breathing   Black Pepper-turmeric Shortness Of Breath   Pravastatin Sodium Shortness Of Breath, Other (See Comments)   Other Reaction(s): difficulty breathing   Rosuvastatin Shortness Of Breath, Other (See Comments)   Other Reaction(s): difficulty breathing   Gluten Meal Other (See Comments)   Other Reaction(s): diarrhea        Medication List     TAKE these medications    AMBULATORY NON FORMULARY MEDICATION Take 2 Doses by mouth as needed (Headache, nausea, pain). Medication Name: SOLPADEINE: PAIN  RELIEF   ascorbic acid 1000 MG tablet Commonly known as: VITAMIN C Take 1,000 mg by mouth daily.   Calcium + D3 600-200 MG-UNIT Tabs Take 1 tablet by mouth in the morning and at bedtime.   Cholecalciferol 25 MCG (1000 UT) capsule Take 1,000 Units by mouth in the morning and at bedtime.   dorzolamide -timolol  2-0.5 % ophthalmic solution Commonly known as: COSOPT  Place 1 drop into both eyes 2 (two) times daily.   fish oil-omega-3 fatty acids 1000 MG capsule Take 1 g by mouth in the morning and at bedtime.   JUICE PLUS FIBRE PO Take 1 tablet by mouth in the morning and at bedtime.   latanoprost  0.005 % ophthalmic solution Commonly known as: XALATAN  Place 1 drop into both eyes at bedtime.   lovastatin 20 MG tablet Commonly known as: MEVACOR Take 20 mg by mouth at bedtime.        Discharge Exam: Filed Weights   10/06/23 1829  Weight: 69.6 kg   ***  Condition at discharge: {DC Condition:26389}  The results of significant diagnostics from this hospitalization (including imaging, microbiology, ancillary and laboratory) are listed below for reference.   Imaging Studies: CT ANGIO HEAD NECK W WO CM Result Date: 10/07/2023 CLINICAL DATA:  Provided history: Stroke, follow-up. Additional history provided: Speech difficulty yesterday. EXAM: CT ANGIOGRAPHY HEAD AND NECK WITH AND WITHOUT CONTRAST TECHNIQUE: Multidetector CT imaging of the head and neck was performed using the standard protocol during bolus administration of intravenous contrast. Multiplanar CT image reconstructions  and MIPs were obtained to evaluate the vascular anatomy. Carotid stenosis measurements (when applicable) are obtained utilizing NASCET criteria, using the distal internal carotid diameter as the denominator. RADIATION DOSE REDUCTION: This exam was performed according to the departmental dose-optimization program which includes automated exposure control, adjustment of the mA and/or kV according to patient size  and/or use of iterative reconstruction technique. CONTRAST:  75mL OMNIPAQUE  IOHEXOL  350 MG/ML SOLN COMPARISON:  Brain MRI 10/06/2023.  Head CT 10/06/2023. FINDINGS: CT HEAD FINDINGS Brain: No age-advanced or lobar predominant cerebral atrophy. Patchy and ill-defined hypoattenuation within the cerebral white matter, nonspecific but compatible with minimal chronic small vessel ischemic disease. There is no acute intracranial hemorrhage. No demarcated cortical infarct. No extra-axial fluid collection. No evidence of an intracranial mass. No midline shift. Vascular: No hyperdense vessel.  Atherosclerotic calcifications. Skull: No calvarial fracture or aggressive osseous lesion. Sinuses/Orbits: No orbital mass or acute orbital finding. No significant paranasal sinus disease. Review of the MIP images confirms the above findings CTA NECK FINDINGS Aortic arch: Standard aortic branching. Atherosclerotic plaque within the aortic arch and proximal major branch vessels of the neck. No hemodynamically significant innominate or proximal subclavian artery stenosis. Right carotid system: CCA and ICA patent within the neck without stenosis or significant atherosclerotic disease. Left carotid system: CCA and ICA patent within the neck without stenosis. Nonstenotic atherosclerotic plaque at the CCA origin. Vertebral arteries: Patent within the neck without stenosis or significant atherosclerotic disease. The left vertebral artery is dominant. Skeleton: C4-C5 grade 1 anterolisthesis. Cervical spondylosis. Disc space narrowing is advanced at C5-C6 and C6-C7. Facet ankylosis on the left at C2-C3. Other neck: No neck mass or cervical lymphadenopathy. Upper chest: No consolidation within the imaged lung apices. Review of the MIP images confirms the above findings CTA HEAD FINDINGS Anterior circulation: The intracranial internal carotid arteries are patent. The M1 middle cerebral arteries are patent. No M2 proximal branch occlusion or  high-grade proximal stenosis. The anterior cerebral arteries are patent. Dominant left anterior cerebral artery. Posterior circulation: The intracranial vertebral arteries are patent. The basilar artery is patent. The posterior cerebral arteries are patent. Mild-to-moderate stenosis within the left posterior cerebral artery at the P1/P2 junction. Posterior communicating arteries are diminutive or absent, bilaterally. Venous sinuses: Within the limitations of contrast timing, no convincing thrombus. Anatomic variants: As described. Review of the MIP images confirms the above findings IMPRESSION: CT head: 1.  No evidence of an acute intracranial abnormality. 2. Minor chronic small vessel ischemic changes within the cerebral white matter. CTA neck: 1. The common carotid, internal carotid and vertebral arteries are patent within the neck without stenosis. Non-stenotic calcified plaque at the left common carotid artery origin. 2. Aortic Atherosclerosis (ICD10-I70.0). CTA head: 1. No proximal intracranial large vessel occlusion or high-grade proximal arterial stenosis identified. 2. Atherosclerotic plaque within the intracranial internal carotid arteries with no more than mild stenosis. Electronically Signed   By: Rockey Childs D.O.   On: 10/07/2023 08:20   EEG adult Result Date: 10/07/2023 Shelton Arlin KIDD, MD     10/07/2023  7:03 AM Patient Name: Tammy Fischer MRN: [id number] Epilepsy Attending: Arlin KIDD Shelton Referring Physician/Provider: Keturah Carrier, MD Date: 10/07/2023 Duration: 24.33 mins Patient history: 73 y.o. female presenting after an episode of LOC with incontinence at home. Level of alertness: Awake, asleep AEDs during EEG study: None Technical aspects: This EEG study was done with scalp electrodes positioned according to the 10-20 International system of electrode placement. Electrical activity was reviewed with band pass filter  of 1-70Hz , sensitivity of 7 uV/mm, display speed of 32mm/sec with a 60Hz   notched filter applied as appropriate. EEG data were recorded continuously and digitally stored.  Video monitoring was available and reviewed as appropriate. Description: The posterior dominant rhythm consists of 9 Hz activity of moderate voltage (25-35 uV) seen predominantly in posterior head regions, symmetric and reactive to eye opening and eye closing. Sleep was characterized by vertex waves, sleep spindles (12 to 14 Hz), maximal frontocentral region.  Hyperventilation and photic stimulation were not performed.   IMPRESSION: This study is within normal limits. No seizures or epileptiform discharges were seen throughout the recording. A normal interictal EEG does not exclude the diagnosis of epilepsy. Arlin MALVA Krebs   MR BRAIN WO CONTRAST Result Date: 10/06/2023 CLINICAL DATA:  Neuro deficit, acute, stroke suspected EXAM: MRI HEAD WITHOUT CONTRAST TECHNIQUE: Multiplanar, multiecho pulse sequences of the brain and surrounding structures were obtained without intravenous contrast. COMPARISON:  Same day CT head. FINDINGS: Brain: No acute infarction, hemorrhage, hydrocephalus, extra-axial collection or mass lesion. Vascular: Normal flow voids. Skull and upper cervical spine: Normal marrow signal. Sinuses/Orbits: Negative. Other: None. IMPRESSION: No evidence of acute intracranial abnormality. Electronically Signed   By: Gilmore GORMAN Molt M.D.   On: 10/06/2023 22:11   CT HEAD CODE STROKE WO CONTRAST Result Date: 10/06/2023 CLINICAL DATA:  Code stroke. Provided history: Neuro deficit, acute, stroke suspected. Confusion. Aphasia. Syncopal episode. EXAM: CT HEAD WITHOUT CONTRAST TECHNIQUE: Contiguous axial images were obtained from the base of the skull through the vertex without intravenous contrast. RADIATION DOSE REDUCTION: This exam was performed according to the departmental dose-optimization program which includes automated exposure control, adjustment of the mA and/or kV according to patient size and/or  use of iterative reconstruction technique. COMPARISON:  None. FINDINGS: Brain: No age-advanced or lobar predominant cerebral atrophy. Patchy and ill-defined hypoattenuation within the cerebral white matter, nonspecific but compatible with mild chronic small vessel ischemic disease. There is no acute intracranial hemorrhage. No demarcated cortical infarct. No extra-axial fluid collection. No evidence of an intracranial mass. No midline shift. Vascular: No hyperdense vessel.  Atherosclerotic calcifications. Skull: No calvarial fracture or aggressive osseous lesion. Sinuses/Orbits: No mass or acute finding within the imaged orbits. No significant paranasal sinus disease. ASPECTS [facility] Stroke Program Early CT Score) - Ganglionic level infarction (caudate, lentiform nuclei, internal capsule, insula, M1-M3 cortex): 7 - Supraganglionic infarction (M4-M6 cortex): 3 Total score (0-10 with 10 being normal): 10 No evidence of an acute intracranial abnormality. These results were communicated to Dr. Merrianne at 6:48 pmon 7/24/2025by text page via the [facility] messaging system. IMPRESSION: 1. No evidence of an acute intracranial abnormality. 2. Mild chronic small vessel ischemic changes within the cerebral white matter. Electronically Signed   By: Rockey Childs D.O.   On: 10/06/2023 18:49    Microbiology: Results for orders placed or performed in visit on 04/06/19  Novel Coronavirus, NAA ([facility])     Status: None   Collection Time: 04/06/19  2:46 PM   Specimen: Nasopharyngeal(NP) swabs in vial transport medium   NASOPHARYNGE  TESTING  Result Value Ref Range Status   SARS-CoV-2, NAA Not Detected Not Detected Final    Comment: This nucleic acid amplification test was developed and its performance characteristics determined by [employer]. Nucleic acid amplification tests include RT-PCR and TMA. This test has not been FDA cleared or approved. This test has been authorized by FDA under an Emergency Use  Authorization (EUA). This test is only authorized for the duration of time  the declaration that circumstances exist justifying the authorization of the emergency use of in vitro diagnostic tests for detection of SARS-CoV-2 virus and/or diagnosis of COVID-19 infection under section 564(b)(1) of the Act, 21 U.S.C. 639aaa-6(a) (1), unless the authorization is terminated or revoked sooner. When diagnostic testing is negative, the possibility of a false negative result should be considered in the context of a patient's recent exposures and the presence of clinical signs and symptoms consistent with COVID-19. An individual without symptoms of COVID-19 and who is not shedding SARS-CoV-2 virus wo uld expect to have a negative (not detected) result in this assay.     Labs: CBC: Recent Labs  Lab 10/06/23 1825 10/06/23 1830 10/07/23 0350  WBC 7.6  --  6.2  NEUTROABS 5.0  --   --   HGB 14.2 15.0 12.8  HCT 40.8 44.0 37.6  MCV 95.1  --  95.9  PLT 195  --  169   Basic Metabolic Panel: Recent Labs  Lab 10/06/23 1825 10/06/23 1830 10/07/23 0350  NA 135 138 139  K 3.4* 3.6 3.7  CL 103 105 108  CO2 21*  --  23  GLUCOSE 140* 138* 106*  BUN 16 18 12   CREATININE 0.80 0.80 0.70  CALCIUM 9.8  --  9.4  MG  --   --  2.0  PHOS  --   --  3.9   Liver Function Tests: Recent Labs  Lab 10/06/23 1825  AST 23  ALT 17  ALKPHOS 61  BILITOT 1.0  PROT 7.6  ALBUMIN 4.1   CBG: Recent Labs  Lab 10/06/23 1825  GLUCAP 146*    Discharge time spent: {LESS THAN/GREATER THAN:26388} 30 minutes.  Signed: Reyes VEAR Gaw, MD [facility] 10/08/2023

## 2023-10-14 DIAGNOSIS — R110 Nausea: Secondary | ICD-10-CM | POA: Diagnosis not present

## 2023-10-14 DIAGNOSIS — G43119 Migraine with aura, intractable, without status migrainosus: Secondary | ICD-10-CM | POA: Diagnosis not present

## 2023-10-14 DIAGNOSIS — R55 Syncope and collapse: Secondary | ICD-10-CM | POA: Diagnosis not present

## 2023-10-18 ENCOUNTER — Telehealth: Payer: Self-pay | Admitting: Neurology

## 2023-10-18 NOTE — Telephone Encounter (Signed)
 [facility] ([facility]) calling in regards to PCP recommending patient needs an earlier appointment due to was seen in emergency room for seizure like activity. Would like a call back.

## 2023-10-18 NOTE — Telephone Encounter (Signed)
 Call to cat at PCP, advised we would reach out to patient with sooner appt.

## 2023-10-25 ENCOUNTER — Ambulatory Visit: Admitting: Neurology

## 2023-10-25 ENCOUNTER — Encounter: Payer: Self-pay | Admitting: Neurology

## 2023-10-25 VITALS — BP 162/99 | HR 83 | Ht 66.0 in | Wt 153.0 lb

## 2023-10-25 DIAGNOSIS — R55 Syncope and collapse: Secondary | ICD-10-CM

## 2023-10-25 NOTE — Progress Notes (Signed)
 [facility]  PATIENT: Tammy Fischer DOB: 06-29-1950  REQUESTING CLINICIAN: Pahwani, Velna SAUNDERS, MD HISTORY FROM: Patient  REASON FOR VISIT: Syncope    HISTORICAL  CHIEF COMPLAINT:  Chief Complaint  Patient presents with   New Patient (Initial Visit)    Rm12, alone, Internal referral for acephalgic migraines: pt reported having dizziness and nausea when she has migraines and ha's. The pt reported about 7 migraine/ha days in past month following hospital stay.  seizure like activity: pt reported that she had dizziness/nausea/migraine which landed her in hospital 10/06/23. Pt is unaware if she fainted or had sz but loss urinary control, experienced confusion.Orthostatic bp completed     HISTORY OF PRESENT ILLNESS:  Discussed the use of AI scribe software for clinical note transcription with the patient, who gave verbal consent to proceed.  Tammy Fischer is a 74 year old female with history of hypertension, hyperlipidemia who presents with an episode of syncope and confusion.  She experienced an acute episode of nausea and dizziness, initially attributing it to a possible allergic reaction to clobetasol shampoo left in her hair for an extended period. After washing it out, she felt unwell and slept for an hour and a half. Upon waking, she experienced background migraine symptoms, including dizziness, nausea, and a slight headache, but not the severe pain she used to have before undergoing chemotherapy 25 years ago.  Later in the day, after a distressing phone call with her ex-husband, she felt the need to lie down but instead found herself on the floor, having lost consciousness. She was unsure how long she was unconscious and noted urinary incontinence upon regaining awareness. She managed to get upstairs, shower, and call her son, but continued to feel increasingly confused, prompting her to instruct her son to call emergency services.  In the emergency room, an EKG was  performed and found to be clear. An EEG and brain imaging were also conducted and negative for any acute findings. She has a history of very mild headaches post-chemotherapy, which induced menopause and significantly reduced her migraine frequency. She has only fainted once before, in 2008 or 2007, likely due to standing up too quickly.  No tongue biting was noted during the episode, and she reported tenderness at the back of her scalp, possibly from the fall.    OTHER MEDICAL CONDITIONS: Hypertension, Hyperlipidemia, History of Migraines    REVIEW OF SYSTEMS: Full 14 system review of systems performed and negative with exception of: As noted in the HPI   ALLERGIES: Allergies  Allergen Reactions   Atorvastatin Other (See Comments)    Other Reaction(s): difficulty breathing   Black Pepper-Turmeric Shortness Of Breath   Pravastatin Sodium Shortness Of Breath and Other (See Comments)    Other Reaction(s): difficulty breathing   Rosuvastatin Shortness Of Breath and Other (See Comments)    Other Reaction(s): difficulty breathing   Gluten Meal Other (See Comments)    Other Reaction(s): diarrhea    HOME MEDICATIONS: Outpatient Medications Prior to Visit  Medication Sig Dispense Refill   AMBULATORY NON FORMULARY MEDICATION Take 2 Doses by mouth as needed (Headache, nausea, pain). Medication Name: SOLPADEINE: PAIN RELIEF     ascorbic acid (VITAMIN C) 1000 MG tablet Take 1,000 mg by mouth daily.     aspirin 81 MG chewable tablet Chew 81 mg by mouth daily.     Calcium Carb-Cholecalciferol (CALCIUM + D3) 600-200 MG-UNIT TABS Take 1 tablet by mouth in the morning and at bedtime.  Cholecalciferol 25 MCG (1000 UT) capsule Take 1,000 Units by mouth in the morning and at bedtime.     dorzolamide -timolol  (COSOPT ) 2-0.5 % ophthalmic solution Place 1 drop into both eyes 2 (two) times daily.     fish oil-omega-3 fatty acids 1000 MG capsule Take 1 g by mouth in the morning and at bedtime.      latanoprost  (XALATAN ) 0.005 % ophthalmic solution Place 1 drop into both eyes at bedtime.     lovastatin (MEVACOR) 20 MG tablet Take 20 mg by mouth at bedtime.     MAGNESIUM MALATE, MAGNESIUM, Take 1 tablet by mouth daily.     Nutritional Supplements (JUICE PLUS FIBRE PO) Take 1 tablet by mouth in the morning and at bedtime.     ondansetron  (ZOFRAN ) 4 MG tablet Take 4 mg by mouth daily as needed.     No facility-administered medications prior to visit.    PAST MEDICAL HISTORY: Past Medical History:  Diagnosis Date   Breast cancer (HCC)    Family history of breast cancer    Family history of lung cancer    Family history of pancreatic cancer    Personal history of breast cancer    Personal history of chemotherapy    Personal history of radiation therapy     PAST SURGICAL HISTORY: Past Surgical History:  Procedure Laterality Date   BREAST BIOPSY     BREAST LUMPECTOMY Left 1999   REDUCTION MAMMAPLASTY     1999    FAMILY HISTORY: Family History  Problem Relation Age of Onset   Lung cancer Mother        d. 87   Pancreatic cancer Father        d. 41   Heart Problems Maternal Grandmother    Heart Problems Maternal Grandfather    Heart Problems Paternal Grandmother    Heart Problems Paternal Grandfather    Breast cancer Other        paternal great aunt, dx. mid 33s    SOCIAL HISTORY: Social History   Socioeconomic History   Marital status: Divorced    Spouse name: Not on file   Number of children: Not on file   Years of education: Not on file   Highest education level: Not on file  Occupational History   Not on file  Tobacco Use   Smoking status: Never   Smokeless tobacco: Never  Substance and Sexual Activity   Alcohol use: Not on file   Drug use: Not on file   Sexual activity: Not on file  Other Topics Concern   Not on file  Social History Narrative   Not on file   Social Drivers of Health   Financial Resource Strain: Not on file  Food Insecurity: No  Food Insecurity (10/07/2023)   Hunger Vital Sign    Worried About Running Out of Food in the Last Year: Never true    Ran Out of Food in the Last Year: Never true  Transportation Needs: No Transportation Needs (10/07/2023)   PRAPARE - Administrator, Civil Service (Medical): No    Lack of Transportation (Non-Medical): No  Physical Activity: Not on file  Stress: Not on file  Social Connections: Socially Integrated (10/07/2023)   Social Connection and Isolation Panel    Frequency of Communication with Friends and Family: More than three times a week    Frequency of Social Gatherings with Friends and Family: More than three times a week    Attends Religious Services:  More than 4 times per year    Active Member of Clubs or Organizations: Yes    Attends Club or Organization Meetings: More than 4 times per year    Marital Status: Married  Catering manager Violence: Not At Risk (10/07/2023)   Humiliation, Afraid, Rape, and Kick questionnaire    Fear of Current or Ex-Partner: No    Emotionally Abused: No    Physically Abused: No    Sexually Abused: No    PHYSICAL EXAM  GENERAL EXAM/CONSTITUTIONAL: Vitals:  Vitals:   10/25/23 1548 10/25/23 1549 10/25/23 1550  BP: (!) 166/87 (!) 157/93 (!) 162/99  Pulse: 75 84 83  SpO2: 95% 95% 96%  Weight: 153 lb (69.4 kg)    Height: 5' 6 (1.676 m)     Body mass index is 24.69 kg/m. Wt Readings from Last 3 Encounters:  10/25/23 153 lb (69.4 kg)  10/06/23 153 lb 7 oz (69.6 kg)  11/26/11 159 lb 11.2 oz (72.4 kg)   Patient is in no distress; well developed, nourished and groomed; neck is supple  MUSCULOSKELETAL: Gait, strength, tone, movements noted in Neurologic exam below  NEUROLOGIC: MENTAL STATUS:      No data to display         awake, alert, oriented to person, place and time recent and remote memory intact normal attention and concentration language fluent, comprehension intact, naming intact fund of knowledge  appropriate  CRANIAL NERVE:  2nd, 3rd, 4th, 6th - Visual fields full to confrontation, extraocular muscles intact, no nystagmus 5th - facial sensation symmetric 7th - facial strength symmetric 8th - hearing intact 9th - palate elevates symmetrically, uvula midline 11th - shoulder shrug symmetric 12th - tongue protrusion midline  MOTOR:  normal bulk and tone, full strength in the BUE, BLE  SENSORY:  normal and symmetric to light touch  COORDINATION:  finger-nose-finger, fine finger movements normal  GAIT/STATION:  normal   DIAGNOSTIC DATA (LABS, IMAGING, TESTING) - I reviewed patient records, labs, notes, testing and imaging myself where available.  Lab Results  Component Value Date   WBC 6.2 10/07/2023   HGB 12.8 10/07/2023   HCT 37.6 10/07/2023   MCV 95.9 10/07/2023   PLT 169 10/07/2023      Component Value Date/Time   NA 139 10/07/2023 0350   NA 139 11/26/2011 1109   K 3.7 10/07/2023 0350   K 4.6 11/26/2011 1109   CL 108 10/07/2023 0350   CL 105 11/26/2011 1109   CO2 23 10/07/2023 0350   CO2 25 11/26/2011 1109   GLUCOSE 106 (H) 10/07/2023 0350   GLUCOSE 79 11/26/2011 1109   BUN 12 10/07/2023 0350   BUN 14.0 11/26/2011 1109   CREATININE 0.70 10/07/2023 0350   CREATININE 0.8 11/26/2011 1109   CALCIUM 9.4 10/07/2023 0350   CALCIUM 9.6 11/26/2011 1109   PROT 7.6 10/06/2023 1825   PROT 6.9 11/26/2011 1109   ALBUMIN 4.1 10/06/2023 1825   ALBUMIN 3.9 11/26/2011 1109   AST 23 10/06/2023 1825   AST 17 11/26/2011 1109   ALT 17 10/06/2023 1825   ALT 16 11/26/2011 1109   ALKPHOS 61 10/06/2023 1825   ALKPHOS 58 11/26/2011 1109   BILITOT 1.0 10/06/2023 1825   BILITOT 0.30 11/26/2011 1109   GFRNONAA >60 10/07/2023 0350   No results found for: CHOL, HDL, LDLCALC, LDLDIRECT, TRIG, CHOLHDL No results found for: YHAJ8R No results found for: VITAMINB12 No results found for: TSH  Routine EEG 10/07/2023 This study is within normal limits. No  seizures or epileptiform discharges were seen throughout the recording. A normal interictal EEG does not exclude the diagnosis of epilepsy   MRI Brain 10/06/2023 No evidence of acute intracranial abnormality    ASSESSMENT AND PLAN  73 y.o. year old female with history of hypertension, hyperlipidemia, history of syncope and headaches who is presenting after a seizure versus syncope. Based on description, suspect syncopal episode. Plan for now is to continue current medications, continue to follow up with PCP and return as needed.    1. Syncope, unspecified syncope type      Patient Instructions  Continue current medications Please contact us  if you have another event Continue follow-up PCP Return as needed    No orders of the defined types were placed in this encounter.   No orders of the defined types were placed in this encounter.   Return if symptoms worsen or fail to improve.    Pastor Falling, MD 10/25/2023, 6:41 PM  [facility] [address] [place], [place] [address] [phone]

## 2023-10-25 NOTE — Patient Instructions (Signed)
 Continue current medications Please contact us  if you have another event Continue follow-up PCP Return as needed

## 2023-11-09 DIAGNOSIS — Z23 Encounter for immunization: Secondary | ICD-10-CM | POA: Diagnosis not present

## 2024-01-16 ENCOUNTER — Ambulatory Visit: Admitting: Neurology

## 2024-01-25 DIAGNOSIS — M47812 Spondylosis without myelopathy or radiculopathy, cervical region: Secondary | ICD-10-CM | POA: Diagnosis not present

## 2024-01-25 DIAGNOSIS — I739 Peripheral vascular disease, unspecified: Secondary | ICD-10-CM | POA: Diagnosis not present

## 2024-01-25 DIAGNOSIS — H409 Unspecified glaucoma: Secondary | ICD-10-CM | POA: Diagnosis not present

## 2024-01-25 DIAGNOSIS — Z853 Personal history of malignant neoplasm of breast: Secondary | ICD-10-CM | POA: Diagnosis not present

## 2024-01-25 DIAGNOSIS — M8580 Other specified disorders of bone density and structure, unspecified site: Secondary | ICD-10-CM | POA: Diagnosis not present

## 2024-01-25 DIAGNOSIS — G479 Sleep disorder, unspecified: Secondary | ICD-10-CM | POA: Diagnosis not present

## 2024-01-25 DIAGNOSIS — R55 Syncope and collapse: Secondary | ICD-10-CM | POA: Diagnosis not present

## 2024-01-25 DIAGNOSIS — Z0000 Encounter for general adult medical examination without abnormal findings: Secondary | ICD-10-CM | POA: Diagnosis not present

## 2024-01-25 DIAGNOSIS — G43119 Migraine with aura, intractable, without status migrainosus: Secondary | ICD-10-CM | POA: Diagnosis not present

## 2024-01-25 DIAGNOSIS — E785 Hyperlipidemia, unspecified: Secondary | ICD-10-CM | POA: Diagnosis not present

## 2024-01-26 ENCOUNTER — Other Ambulatory Visit (HOSPITAL_BASED_OUTPATIENT_CLINIC_OR_DEPARTMENT_OTHER): Payer: Self-pay | Admitting: Internal Medicine

## 2024-01-26 DIAGNOSIS — Z136 Encounter for screening for cardiovascular disorders: Secondary | ICD-10-CM

## 2024-01-30 DIAGNOSIS — M7062 Trochanteric bursitis, left hip: Secondary | ICD-10-CM | POA: Diagnosis not present

## 2024-01-30 DIAGNOSIS — M5416 Radiculopathy, lumbar region: Secondary | ICD-10-CM | POA: Diagnosis not present

## 2024-02-14 ENCOUNTER — Ambulatory Visit (HOSPITAL_BASED_OUTPATIENT_CLINIC_OR_DEPARTMENT_OTHER)
Admission: RE | Admit: 2024-02-14 | Discharge: 2024-02-14 | Disposition: A | Discharge status: Home / Self Care | Payer: Self-pay | Source: Ambulatory Visit | Attending: Internal Medicine | Admitting: Internal Medicine

## 2024-02-14 DIAGNOSIS — Z136 Encounter for screening for cardiovascular disorders: Secondary | ICD-10-CM | POA: Insufficient documentation

## 2024-03-26 ENCOUNTER — Other Ambulatory Visit: Payer: Self-pay | Admitting: Internal Medicine

## 2024-03-26 DIAGNOSIS — Z1231 Encounter for screening mammogram for malignant neoplasm of breast: Secondary | ICD-10-CM

## 2024-05-10 ENCOUNTER — Ambulatory Visit
